# Patient Record
Sex: Male | Born: 1940 | Race: White | Hispanic: No | Marital: Married | Smoking: Never smoker
Health system: Southern US, Community
[De-identification: ages and names within clinical notes are randomized; demographics above are authoritative.]

## PROBLEM LIST (undated history)

## (undated) DIAGNOSIS — K219 Gastro-esophageal reflux disease without esophagitis: Secondary | ICD-10-CM

## (undated) DIAGNOSIS — K5790 Diverticulosis of intestine, part unspecified, without perforation or abscess without bleeding: Secondary | ICD-10-CM

## (undated) DIAGNOSIS — J302 Other seasonal allergic rhinitis: Secondary | ICD-10-CM

## (undated) DIAGNOSIS — N529 Male erectile dysfunction, unspecified: Secondary | ICD-10-CM

## (undated) DIAGNOSIS — E785 Hyperlipidemia, unspecified: Secondary | ICD-10-CM

## (undated) DIAGNOSIS — I499 Cardiac arrhythmia, unspecified: Secondary | ICD-10-CM

## (undated) DIAGNOSIS — M1990 Unspecified osteoarthritis, unspecified site: Secondary | ICD-10-CM

## (undated) DIAGNOSIS — I10 Essential (primary) hypertension: Secondary | ICD-10-CM

## (undated) DIAGNOSIS — R251 Tremor, unspecified: Secondary | ICD-10-CM

## (undated) DIAGNOSIS — H9319 Tinnitus, unspecified ear: Secondary | ICD-10-CM

## (undated) DIAGNOSIS — R4702 Dysphasia: Secondary | ICD-10-CM

## (undated) DIAGNOSIS — H9190 Unspecified hearing loss, unspecified ear: Secondary | ICD-10-CM

## (undated) HISTORY — PX: TONSILLECTOMY: SUR1361

## (undated) HISTORY — PX: SATURATION BIOPSY OF PROSTATE: SHX2375

## (undated) HISTORY — PX: OTHER SURGICAL HISTORY: SHX169

## (undated) HISTORY — PX: COLON SURGERY: SHX602

---

## 1997-09-25 ENCOUNTER — Ambulatory Visit (HOSPITAL_COMMUNITY): Admission: RE | Admit: 1997-09-25 | Discharge: 1997-09-25 | Payer: Self-pay | Admitting: Gastroenterology

## 2002-11-21 ENCOUNTER — Ambulatory Visit (HOSPITAL_COMMUNITY): Admission: RE | Admit: 2002-11-21 | Discharge: 2002-11-21 | Payer: Self-pay | Admitting: Gastroenterology

## 2006-05-05 ENCOUNTER — Inpatient Hospital Stay (HOSPITAL_COMMUNITY): Admission: EM | Admit: 2006-05-05 | Discharge: 2006-05-06 | Payer: Self-pay | Admitting: Emergency Medicine

## 2011-01-31 ENCOUNTER — Other Ambulatory Visit: Payer: Self-pay | Admitting: Dermatology

## 2012-10-07 ENCOUNTER — Other Ambulatory Visit: Payer: Self-pay | Admitting: Internal Medicine

## 2012-10-07 DIAGNOSIS — R1310 Dysphagia, unspecified: Secondary | ICD-10-CM

## 2012-10-18 ENCOUNTER — Ambulatory Visit
Admission: RE | Admit: 2012-10-18 | Discharge: 2012-10-18 | Disposition: A | Discharge status: Home / Self Care | Payer: Medicare Other | Source: Ambulatory Visit | Attending: Internal Medicine | Admitting: Internal Medicine

## 2012-10-18 DIAGNOSIS — R1310 Dysphagia, unspecified: Secondary | ICD-10-CM

## 2014-04-30 ENCOUNTER — Ambulatory Visit: Payer: Self-pay | Admitting: Orthopedic Surgery

## 2014-04-30 NOTE — Progress Notes (Signed)
Preoperative surgical orders have been place into the Epic hospital system for Dominic Young on 04/30/2014, 5:10 PM  by Mickel Crow for surgery on 05-04-2014.  Preop Total Knee orders including Experal, IV Tylenol, and IV Decadron as long as there are no contraindications to the above medications. PLEASE NOTE THAT THIS PATIENT WAS MOVED UP FROM 05/18/2014 TO 05/04/2014 BUT WOULD HAVE STILL BEEN OUT 10 DAYS ON ORDERS IF NOT MOVED TO AN EARLIER DATE. Arlee Muslim, PA-C

## 2014-04-30 NOTE — Progress Notes (Signed)
Please put orders in Epic surgery 05-04-14 pre op 05-01-14 Thanks

## 2014-05-01 ENCOUNTER — Encounter (HOSPITAL_COMMUNITY)
Admission: RE | Admit: 2014-05-01 | Discharge: 2014-05-01 | Disposition: A | Discharge status: Home / Self Care | Payer: Medicare Other | Source: Ambulatory Visit | Attending: Orthopedic Surgery | Admitting: Orthopedic Surgery

## 2014-05-01 ENCOUNTER — Encounter (HOSPITAL_COMMUNITY): Payer: Self-pay

## 2014-05-01 HISTORY — DX: Hyperlipidemia, unspecified: E78.5

## 2014-05-01 HISTORY — DX: Tremor, unspecified: R25.1

## 2014-05-01 HISTORY — DX: Diverticulosis of intestine, part unspecified, without perforation or abscess without bleeding: K57.90

## 2014-05-01 HISTORY — DX: Cardiac arrhythmia, unspecified: I49.9

## 2014-05-01 HISTORY — DX: Male erectile dysfunction, unspecified: N52.9

## 2014-05-01 HISTORY — DX: Dysphasia: R47.02

## 2014-05-01 HISTORY — DX: Unspecified osteoarthritis, unspecified site: M19.90

## 2014-05-01 HISTORY — DX: Gastro-esophageal reflux disease without esophagitis: K21.9

## 2014-05-01 HISTORY — DX: Tinnitus, unspecified ear: H93.19

## 2014-05-01 HISTORY — DX: Other seasonal allergic rhinitis: J30.2

## 2014-05-01 HISTORY — DX: Essential (primary) hypertension: I10

## 2014-05-01 HISTORY — DX: Unspecified hearing loss, unspecified ear: H91.90

## 2014-05-01 LAB — SURGICAL PCR SCREEN
MRSA, PCR: NEGATIVE
Staphylococcus aureus: NEGATIVE

## 2014-05-01 LAB — ABO/RH: ABO/RH(D): A POS

## 2014-05-01 LAB — APTT: APTT: 28 s (ref 24–37)

## 2014-05-01 LAB — PROTIME-INR
INR: 0.98 (ref 0.00–1.49)
PROTHROMBIN TIME: 13.1 s (ref 11.6–15.2)

## 2014-05-01 NOTE — Progress Notes (Addendum)
Clearance note per chart Dr Dagmar Hait 04/28/2014 CXR per chart 02/04/2014  CMP and CBC results per chart 04/28/2014  Urinalysis and micro results per chart 04/28/2014  EKG per chart 02/04/2014  Noted per H&P per Dr Dagmar Hait pt was prescribed propanolol for tremors pt states only took 3 tabs and saw no different so he discontinued use

## 2014-05-01 NOTE — Patient Instructions (Signed)
20 Dominic Young  05/01/2014   Your procedure is scheduled on:  Monday May 04, 2014   Report to [facility] Main Entrance and follow signs to  [place] arrive at 5:00 AM.   Call this number if you have problems the morning of surgery [phone] or Presurgical Testing [phone].   Remember:  Do not eat food or drink liquids :After Midnight.  For Living Will and/or Health Care Power Attorney Forms: please provide copy for your medical record, may bring AM of surgery (forms should be already notarized-we do not provide this service).     Take these medicines the morning of surgery with A SIP OF WATER: Pravastatin                               You may not have any metal on your body including hair pins and piercings  Do not wear jewelry,lotions, powders,colognes or deodorant.  Men may shave face and neck.               Do not bring valuables to the hospital. [place].  Contacts, dentures or bridgework may not be worn into surgery.  Leave suitcase in the car. After surgery it may be brought to your room.  For patients admitted to the hospital, checkout time is 11:00 AM the day of discharge.   Special Instructions: review fact sheets for MRSA information, Blood Transfusion fact sheet, Incentive Spirometry.  Remember: Type/Screen "Blue armsbands"- may not be removed once applied(would result in being retested AM of surgery, if removed). ________________________________________________________________________  [facility] - Preparing for Surgery Before surgery, you can play an important role.  Because skin is not sterile, your skin needs to be as free of germs as possible.  You can reduce the number of germs on your skin by washing with CHG (chlorahexidine gluconate) soap before surgery.  CHG is an antiseptic cleaner which kills germs and bonds with the skin to continue killing germs even after washing. Please DO NOT use if you  have an allergy to CHG or antibacterial soaps.  If your skin becomes reddened/irritated stop using the CHG and inform your nurse when you arrive at Short Stay. Do not shave (including legs and underarms) for at least 48 hours prior to the first CHG shower.  You may shave your face/neck. Please follow these instructions carefully:  1.  Shower with CHG Soap the night before surgery and the  morning of Surgery.  2.  If you choose to wash your hair, wash your hair first as usual with your  normal  shampoo.  3.  After you shampoo, rinse your hair and body thoroughly to remove the  shampoo.                           4.  Use CHG as you would any other liquid soap.  You can apply chg directly  to the skin and wash                       Gently with a scrungie or clean washcloth.  5.  Apply the CHG Soap to your body ONLY FROM THE NECK DOWN.   Do not use on face/ open  Wound or open sores. Avoid contact with eyes, ears mouth and genitals (private parts).                       Wash face,  Genitals (private parts) with your normal soap.             6.  Wash thoroughly, paying special attention to the area where your surgery  will be performed.  7.  Thoroughly rinse your body with warm water from the neck down.  8.  DO NOT shower/wash with your normal soap after using and rinsing off  the CHG Soap.                9.  Pat yourself dry with a clean towel.            10.  Wear clean pajamas.            11.  Place clean sheets on your bed the night of your first shower and do not  sleep with pets. Day of Surgery : Do not apply any lotions/deodorants the morning of surgery.  Please wear clean clothes to the hospital/surgery center.  FAILURE TO FOLLOW THESE INSTRUCTIONS MAY RESULT IN THE CANCELLATION OF YOUR SURGERY PATIENT SIGNATURE_________________________________  NURSE  SIGNATURE__________________________________  ________________________________________________________________________   Dominic Young  An incentive spirometer is a tool that can help keep your lungs clear and active. This tool measures how well you are filling your lungs with each breath. Taking long deep breaths may help reverse or decrease the chance of developing breathing (pulmonary) problems (especially infection) following:  A long period of time when you are unable to move or be active. BEFORE THE PROCEDURE   If the spirometer includes an indicator to show your best effort, your nurse or respiratory therapist will set it to a desired goal.  If possible, sit up straight or lean slightly forward. Try not to slouch.  Hold the incentive spirometer in an upright position. INSTRUCTIONS FOR USE   Sit on the edge of your bed if possible, or sit up as far as you can in bed or on a chair.  Hold the incentive spirometer in an upright position.  Breathe out normally.  Place the mouthpiece in your mouth and seal your lips tightly around it.  Breathe in slowly and as deeply as possible, raising the piston or the ball toward the top of the column.  Hold your breath for 3-5 seconds or for as long as possible. Allow the piston or ball to fall to the bottom of the column.  Remove the mouthpiece from your mouth and breathe out normally.  Rest for a few seconds and repeat Steps 1 through 7 at least 10 times every 1-2 hours when you are awake. Take your time and take a few normal breaths between deep breaths.  The spirometer may include an indicator to show your best effort. Use the indicator as a goal to work toward during each repetition.  After each set of 10 deep breaths, practice coughing to be sure your lungs are clear. If you have an incision (the cut made at the time of surgery), support your incision when coughing by placing a pillow or rolled up towels firmly against it. Once  you are able to get out of bed, walk around indoors and cough well. You may stop using the incentive spirometer when instructed by your caregiver.  RISKS AND COMPLICATIONS  Take your time so you do not  get dizzy or light-headed.  If you are in pain, you may need to take or ask for pain medication before doing incentive spirometry. It is harder to take a deep breath if you are having pain. AFTER USE  Rest and breathe slowly and easily.  It can be helpful to keep track of a log of your progress. Your caregiver can provide you with a simple table to help with this. If you are using the spirometer at home, follow these instructions: [place] IF:   You are having difficultly using the spirometer.  You have trouble using the spirometer as often as instructed.  Your pain medication is not giving enough relief while using the spirometer.  You develop fever of 100.5 F (38.1 C) or higher. SEEK IMMEDIATE MEDICAL CARE IF:   You cough up bloody sputum that had not been present before.  You develop fever of 102 F (38.9 C) or greater.  You develop worsening pain at or near the incision site. MAKE SURE YOU:   Understand these instructions.  Will watch your condition.  Will get help right away if you are not doing well or get worse. Document Released: 07/31/2006 Document Revised: 06/12/2011 Document Reviewed: 10/01/2006 Exit[employer] Patient Information 2014 [employer], [place].   ________________________________________________________________________  WHAT IS A BLOOD TRANSFUSION? Blood Transfusion Information  A transfusion is the replacement of blood or some of its parts. Blood is made up of multiple cells which provide different functions.  Red blood cells carry oxygen and are used for blood loss replacement.  White blood cells fight against infection.  Platelets control bleeding.  Plasma helps clot blood.  Other blood products are available for specialized needs, such as  hemophilia or other clotting disorders. BEFORE THE TRANSFUSION  Who gives blood for transfusions?   Healthy volunteers who are fully evaluated to make sure their blood is safe. This is blood bank blood. Transfusion therapy is the safest it has ever been in the practice of medicine. Before blood is taken from a donor, a complete history is taken to make sure that person has no history of diseases nor engages in risky social behavior (examples are intravenous drug use or sexual activity with multiple partners). The donor's travel history is screened to minimize risk of transmitting infections, such as malaria. The donated blood is tested for signs of infectious diseases, such as HIV and hepatitis. The blood is then tested to be sure it is compatible with you in order to minimize the chance of a transfusion reaction. If you or a relative donates blood, this is often done in anticipation of surgery and is not appropriate for emergency situations. It takes many days to process the donated blood. RISKS AND COMPLICATIONS Although transfusion therapy is very safe and saves many lives, the main dangers of transfusion include:   Getting an infectious disease.  Developing a transfusion reaction. This is an allergic reaction to something in the blood you were given. Every precaution is taken to prevent this. The decision to have a blood transfusion has been considered carefully by your caregiver before blood is given. Blood is not given unless the benefits outweigh the risks. AFTER THE TRANSFUSION  Right after receiving a blood transfusion, you will usually feel much better and more energetic. This is especially true if your red blood cells have gotten low (anemic). The transfusion raises the level of the red blood cells which carry oxygen, and this usually causes an energy increase.  The nurse administering the transfusion will  monitor you carefully for complications. HOME CARE INSTRUCTIONS  No special  instructions are needed after a transfusion. You may find your energy is better. Speak with your caregiver about any limitations on activity for underlying diseases you may have. SEEK MEDICAL CARE IF:   Your condition is not improving after your transfusion.  You develop redness or irritation at the intravenous (IV) site. SEEK IMMEDIATE MEDICAL CARE IF:  Any of the following symptoms occur over the next 12 hours:  Shaking chills.  You have a temperature by mouth above 102 F (38.9 C), not controlled by medicine.  Chest, back, or muscle pain.  People around you feel you are not acting correctly or are confused.  Shortness of breath or difficulty breathing.  Dizziness and fainting.  You get a rash or develop hives.  You have a decrease in urine output.  Your urine turns a dark color or changes to pink, red, or brown. Any of the following symptoms occur over the next 10 days:  You have a temperature by mouth above 102 F (38.9 C), not controlled by medicine.  Shortness of breath.  Weakness after normal activity.  The white part of the eye turns yellow (jaundice).  You have a decrease in the amount of urine or are urinating less often.  Your urine turns a dark color or changes to pink, red, or brown. Document Released: 03/17/2000 Document Revised: 06/12/2011 Document Reviewed: 11/04/2007 [facility] Patient Information 2014 [place], [place].  _______________________________________________________________________

## 2014-05-01 NOTE — Progress Notes (Signed)
Your patient has screened at an elevated risk for Obstructive Sleep Apnea using the Stop-Bang Tool during a pre-surgical vist. A score of 4 or greater is an elevated risk. Score of 4. 

## 2014-05-01 NOTE — H&P (Signed)
TOTAL KNEE ADMISSION H&P  Patient is being admitted for right total knee arthroplasty.  Subjective:  Chief Complaint:right knee pain.  HPI: Dominic Young, 74 y.o. male, has a history of pain and functional disability in the right knee due to arthritis and has failed non-surgical conservative treatments for greater than 12 weeks to includeNSAID's and/or analgesics, corticosteriod injections and activity modification.  Onset of symptoms was gradual, starting >10 years ago with gradually worsening course since that time. The patient noted prior procedures on the knee to include  arthroscopy and menisectomy on the right knee(s).  Patient currently rates pain in the right knee(s) at 7 out of 10 with activity. Patient has night pain, worsening of pain with activity and weight bearing, pain that interferes with activities of daily living, pain with passive range of motion, crepitus and joint swelling.  Patient has evidence of periarticular osteophytes and joint space narrowing by imaging studies. There is no active infection.   Past Medical History  Diagnosis Date  . Hypertension   . Dysrhythmia     when young had issues with heart palpitations altered diet and has not had problems since   . GERD (gastroesophageal reflux disease)     manages with diet   . Arthritis     Past Surgical History  Procedure Laterality Date  . Right knee sugery      1965 cartilage removed 1983 repaired of damaged ligament   . Tonsillectomy    . Saturation biopsy of prostate    . Colonscopy     . 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  aspirin EC 81 MG tablet, Take 81 mg by mouth daily., Disp: , Rfl:  .  ibuprofen (ADVIL,MOTRIN) 200 MG tablet, Take 400 mg by mouth every 6 (six) hours as needed for mild pain., Disp: , Rfl:  .  losartan (COZAAR) 50 MG tablet, Take 50 mg by mouth every morning., Disp: , Rfl:  .  pravastatin (PRAVACHOL) 40 MG tablet, Take 40 mg  by mouth daily., Disp: , Rfl:   Allergies  Allergen Reactions  . Xylocaine [Lidocaine]     Heart races    History  Substance Use Topics  . Smoking status: Never Smoker   . Smokeless tobacco: Never Used  . Alcohol Use: No      Review of Systems  Constitutional: Negative.   HENT: Negative.   Eyes: Negative.   Respiratory: Negative.   Cardiovascular: Negative.   Gastrointestinal: Positive for heartburn. Negative for nausea, vomiting, abdominal pain, diarrhea, constipation, blood in stool and melena.  Genitourinary: Negative.   Musculoskeletal: Positive for joint pain. Negative for myalgias, back pain, falls and neck pain.       Right knee pain  Skin: Negative.   Neurological: Negative.   Endo/Heme/Allergies: Negative.   Psychiatric/Behavioral: Negative.     Objective:  Physical Exam  Constitutional: He is oriented to person, place, and time. He appears well-developed and well-nourished. No distress.  HENT:  Head: Normocephalic and atraumatic.  Right Ear: External ear normal.  Left Ear: External ear normal.  Nose: Nose normal.  Mouth/Throat: Oropharynx is clear and moist.  Eyes: Conjunctivae and EOM are normal.  Neck: Normal range of motion. Neck supple.  Cardiovascular: Normal rate, regular rhythm, normal heart sounds and intact distal pulses.   No murmur heard. Respiratory: Effort normal and breath sounds normal. No respiratory distress. He has no wheezes.  GI: Soft. Bowel  sounds are normal. He exhibits no distension. There is no tenderness.  Musculoskeletal:       Right hip: Normal.       Left hip: Normal.       Right knee: He exhibits decreased range of motion and swelling. He exhibits no effusion and no erythema. Tenderness found. Medial joint line and lateral joint line tenderness noted.       Left knee: Normal.       Right lower leg: He exhibits no tenderness and no swelling.       Left lower leg: He exhibits no tenderness and no swelling.  His right knee  shows no effusion. There is marked crepitus on range of motion of the right knee. Range is about 5-130. Tenderness medial greater than lateral with no instability.  Neurological: He is alert and oriented to person, place, and time. He has normal strength and normal reflexes. No sensory deficit.  Skin: No rash noted. He is not diaphoretic. No erythema.  Psychiatric: He has a normal mood and affect. His behavior is normal.    Vital signs in last 24 hours: Temp:  [98 F (36.7 C)] 98 F (36.7 C) (01/29 0819) Pulse Rate:  [64] 64 (01/29 0819) Resp:  [16] 16 (01/29 0819) BP: (158)/(68) 158/68 mmHg (01/29 0819) SpO2:  [98 %] 98 % (01/29 0819) Weight:  [95.255 kg (210 lb)] 95.255 kg (210 lb) (01/29 0819)  Imaging Review Plain radiographs demonstrate severe degenerative joint disease of the right knee(s). The overall alignment ismild varus. The bone quality appears to be good for age and reported activity level.  Assessment/Plan:  End stage arthritis, right knee   The patient history, physical examination, clinical judgment of the provider and imaging studies are consistent with end stage degenerative joint disease of the right knee(s) and total knee arthroplasty is deemed medically necessary. The treatment options including medical management, injection therapy arthroscopy and arthroplasty were discussed at length. The risks and benefits of total knee arthroplasty were presented and reviewed. The risks due to aseptic loosening, infection, stiffness, patella tracking problems, thromboembolic complications and other imponderables were discussed. The patient acknowledged the explanation, agreed to proceed with the plan and consent was signed. Patient is being admitted for inpatient treatment for surgery, pain control, PT, OT, prophylactic antibiotics, VTE prophylaxis, progressive ambulation and ADL's and discharge planning. The patient is planning to be discharged to skilled nursing facility     TXA IV Does not want [facility] PCP: Dr. Darra Lis, PA-C

## 2014-05-04 ENCOUNTER — Encounter (HOSPITAL_COMMUNITY)
Admission: RE | Disposition: A | Discharge status: Home Health | Payer: Self-pay | Source: Ambulatory Visit | Attending: Orthopedic Surgery

## 2014-05-04 ENCOUNTER — Inpatient Hospital Stay (HOSPITAL_COMMUNITY)
Admission: RE | Admit: 2014-05-04 | Discharge: 2014-05-06 | DRG: 470 | Disposition: A | Discharge status: Home Health | Payer: Medicare Other | Source: Ambulatory Visit | Attending: Orthopedic Surgery | Admitting: Orthopedic Surgery

## 2014-05-04 ENCOUNTER — Inpatient Hospital Stay (HOSPITAL_COMMUNITY): Payer: Medicare Other | Admitting: Anesthesiology

## 2014-05-04 ENCOUNTER — Encounter (HOSPITAL_COMMUNITY): Payer: Self-pay | Admitting: *Deleted

## 2014-05-04 DIAGNOSIS — H9192 Unspecified hearing loss, left ear: Secondary | ICD-10-CM | POA: Diagnosis present

## 2014-05-04 DIAGNOSIS — Z79899 Other long term (current) drug therapy: Secondary | ICD-10-CM

## 2014-05-04 DIAGNOSIS — Z7982 Long term (current) use of aspirin: Secondary | ICD-10-CM | POA: Diagnosis not present

## 2014-05-04 DIAGNOSIS — M1711 Unilateral primary osteoarthritis, right knee: Secondary | ICD-10-CM | POA: Diagnosis present

## 2014-05-04 DIAGNOSIS — K219 Gastro-esophageal reflux disease without esophagitis: Secondary | ICD-10-CM | POA: Diagnosis present

## 2014-05-04 DIAGNOSIS — I10 Essential (primary) hypertension: Secondary | ICD-10-CM | POA: Diagnosis present

## 2014-05-04 DIAGNOSIS — M179 Osteoarthritis of knee, unspecified: Secondary | ICD-10-CM | POA: Diagnosis present

## 2014-05-04 DIAGNOSIS — Z01812 Encounter for preprocedural laboratory examination: Secondary | ICD-10-CM | POA: Diagnosis not present

## 2014-05-04 DIAGNOSIS — M25561 Pain in right knee: Secondary | ICD-10-CM | POA: Diagnosis present

## 2014-05-04 DIAGNOSIS — M1710 Unilateral primary osteoarthritis, unspecified knee: Secondary | ICD-10-CM | POA: Diagnosis present

## 2014-05-04 DIAGNOSIS — M1712 Unilateral primary osteoarthritis, left knee: Secondary | ICD-10-CM | POA: Diagnosis present

## 2014-05-04 HISTORY — PX: TOTAL KNEE ARTHROPLASTY: SHX125

## 2014-05-04 LAB — TYPE AND SCREEN
ABO/RH(D): A POS
Antibody Screen: NEGATIVE

## 2014-05-04 SURGERY — ARTHROPLASTY, KNEE, TOTAL
Anesthesia: Spinal | Laterality: Right

## 2014-05-04 MED ORDER — ONDANSETRON HCL 4 MG/2ML IJ SOLN
INTRAMUSCULAR | Status: DC | PRN
  Administered 2014-05-04: 4 mg via INTRAVENOUS

## 2014-05-04 MED ORDER — ACETAMINOPHEN 650 MG RE SUPP: 650.0000 mg | Freq: Four times a day (QID) | RECTAL | Status: DC | PRN

## 2014-05-04 MED ORDER — METOCLOPRAMIDE HCL 10 MG PO TABS: 5.0000 mg | ORAL_TABLET | Freq: Three times a day (TID) | ORAL | Status: DC | PRN

## 2014-05-04 MED ORDER — CEFAZOLIN SODIUM-DEXTROSE 2-3 GM-% IV SOLR
2.0000 g | INTRAVENOUS | Status: AC
  Administered 2014-05-04: 2 g via INTRAVENOUS

## 2014-05-04 MED ORDER — CEFAZOLIN SODIUM-DEXTROSE 2-3 GM-% IV SOLR
INTRAVENOUS | Status: AC
  Filled 2014-05-04: qty 50

## 2014-05-04 MED ORDER — SODIUM CHLORIDE 0.9 % IV SOLN
INTRAVENOUS | Status: DC
  Administered 2014-05-04 (×2): via INTRAVENOUS

## 2014-05-04 MED ORDER — MORPHINE SULFATE 2 MG/ML IJ SOLN: 1.0000 mg | INTRAMUSCULAR | Status: DC | PRN

## 2014-05-04 MED ORDER — DEXAMETHASONE SODIUM PHOSPHATE 10 MG/ML IJ SOLN
INTRAMUSCULAR | Status: AC
  Filled 2014-05-04: qty 1

## 2014-05-04 MED ORDER — BUPIVACAINE IN DEXTROSE 0.75-8.25 % IT SOLN
INTRATHECAL | Status: DC | PRN
  Administered 2014-05-04: 1.5 mL via INTRATHECAL

## 2014-05-04 MED ORDER — RIVAROXABAN 10 MG PO TABS
10.0000 mg | ORAL_TABLET | Freq: Every day | ORAL | Status: DC
  Administered 2014-05-05 – 2014-05-06 (×2): 10 mg via ORAL
  Filled 2014-05-04 (×3): qty 1

## 2014-05-04 MED ORDER — KETOROLAC TROMETHAMINE 15 MG/ML IJ SOLN
INTRAMUSCULAR | Status: AC
  Filled 2014-05-04: qty 1

## 2014-05-04 MED ORDER — KETOROLAC TROMETHAMINE 15 MG/ML IJ SOLN
7.5000 mg | Freq: Four times a day (QID) | INTRAMUSCULAR | Status: AC | PRN
  Administered 2014-05-04: 7.5 mg via INTRAVENOUS

## 2014-05-04 MED ORDER — PHENYLEPHRINE 40 MCG/ML (10ML) SYRINGE FOR IV PUSH (FOR BLOOD PRESSURE SUPPORT)
PREFILLED_SYRINGE | INTRAVENOUS | Status: AC
  Filled 2014-05-04: qty 10

## 2014-05-04 MED ORDER — PHENOL 1.4 % MT LIQD
1.0000 | OROMUCOSAL | Status: DC | PRN
  Filled 2014-05-04: qty 177

## 2014-05-04 MED ORDER — HYDROMORPHONE HCL 1 MG/ML IJ SOLN
INTRAMUSCULAR | Status: AC
  Filled 2014-05-04: qty 1

## 2014-05-04 MED ORDER — LACTATED RINGERS IV SOLN
INTRAVENOUS | Status: DC | PRN
  Administered 2014-05-04 (×2): via INTRAVENOUS

## 2014-05-04 MED ORDER — FENTANYL CITRATE 0.05 MG/ML IJ SOLN
INTRAMUSCULAR | Status: AC
  Filled 2014-05-04: qty 2

## 2014-05-04 MED ORDER — SODIUM CHLORIDE 0.9 % IV SOLN
INTRAVENOUS | Status: DC
Start: 2014-05-04 — End: 2014-05-04

## 2014-05-04 MED ORDER — OXYCODONE HCL 5 MG PO TABS
5.0000 mg | ORAL_TABLET | ORAL | Status: DC | PRN
  Administered 2014-05-04 – 2014-05-05 (×4): 5 mg via ORAL
  Administered 2014-05-05: 10 mg via ORAL
  Administered 2014-05-05 (×5): 5 mg via ORAL
  Administered 2014-05-06 (×3): 10 mg via ORAL
  Filled 2014-05-04: qty 1
  Filled 2014-05-04 (×2): qty 2
  Filled 2014-05-04: qty 1
  Filled 2014-05-04 (×2): qty 2
  Filled 2014-05-04 (×5): qty 1

## 2014-05-04 MED ORDER — MIDAZOLAM HCL 2 MG/2ML IJ SOLN
INTRAMUSCULAR | Status: AC
  Filled 2014-05-04: qty 2

## 2014-05-04 MED ORDER — ONDANSETRON HCL 4 MG PO TABS: 4.0000 mg | ORAL_TABLET | Freq: Four times a day (QID) | ORAL | Status: DC | PRN

## 2014-05-04 MED ORDER — PROPOFOL 10 MG/ML IV BOLUS
INTRAVENOUS | Status: AC
  Filled 2014-05-04: qty 20

## 2014-05-04 MED ORDER — METHOCARBAMOL 500 MG PO TABS: 500.0000 mg | ORAL_TABLET | Freq: Four times a day (QID) | ORAL | Status: DC | PRN

## 2014-05-04 MED ORDER — MENTHOL 3 MG MT LOZG
1.0000 | LOZENGE | OROMUCOSAL | Status: DC | PRN
  Filled 2014-05-04: qty 9

## 2014-05-04 MED ORDER — CEFAZOLIN SODIUM-DEXTROSE 2-3 GM-% IV SOLR
2.0000 g | Freq: Four times a day (QID) | INTRAVENOUS | Status: AC
  Administered 2014-05-04 (×2): 2 g via INTRAVENOUS
  Filled 2014-05-04 (×2): qty 50

## 2014-05-04 MED ORDER — DEXAMETHASONE SODIUM PHOSPHATE 10 MG/ML IJ SOLN
10.0000 mg | Freq: Once | INTRAMUSCULAR | Status: AC
  Administered 2014-05-05: 10 mg via INTRAVENOUS
  Filled 2014-05-04: qty 1

## 2014-05-04 MED ORDER — PHENYLEPHRINE HCL 10 MG/ML IJ SOLN
INTRAMUSCULAR | Status: DC | PRN
  Administered 2014-05-04: 80 ug via INTRAVENOUS
  Administered 2014-05-04 (×2): 40 ug via INTRAVENOUS

## 2014-05-04 MED ORDER — TRAMADOL HCL 50 MG PO TABS: 50.0000 mg | ORAL_TABLET | Freq: Four times a day (QID) | ORAL | Status: DC | PRN

## 2014-05-04 MED ORDER — TRANEXAMIC ACID 100 MG/ML IV SOLN
1000.0000 mg | INTRAVENOUS | Status: AC
  Administered 2014-05-04: 1000 mg via INTRAVENOUS
  Filled 2014-05-04: qty 10

## 2014-05-04 MED ORDER — ACETAMINOPHEN 325 MG PO TABS: 650.0000 mg | ORAL_TABLET | Freq: Four times a day (QID) | ORAL | Status: DC | PRN

## 2014-05-04 MED ORDER — HYDROMORPHONE HCL 1 MG/ML IJ SOLN
0.2500 mg | INTRAMUSCULAR | Status: DC | PRN
  Administered 2014-05-04 (×4): 0.5 mg via INTRAVENOUS

## 2014-05-04 MED ORDER — ONDANSETRON HCL 4 MG/2ML IJ SOLN: 4.0000 mg | Freq: Four times a day (QID) | INTRAMUSCULAR | Status: DC | PRN

## 2014-05-04 MED ORDER — MIDAZOLAM HCL 5 MG/5ML IJ SOLN
INTRAMUSCULAR | Status: DC | PRN
  Administered 2014-05-04: 2 mg via INTRAVENOUS

## 2014-05-04 MED ORDER — ACETAMINOPHEN 500 MG PO TABS
1000.0000 mg | ORAL_TABLET | Freq: Four times a day (QID) | ORAL | Status: AC
  Administered 2014-05-04 – 2014-05-05 (×4): 1000 mg via ORAL
  Filled 2014-05-04 (×4): qty 2

## 2014-05-04 MED ORDER — LOSARTAN POTASSIUM 50 MG PO TABS
50.0000 mg | ORAL_TABLET | Freq: Every morning | ORAL | Status: DC
  Administered 2014-05-04 – 2014-05-06 (×3): 50 mg via ORAL
  Filled 2014-05-04 (×3): qty 1

## 2014-05-04 MED ORDER — FENTANYL CITRATE 0.05 MG/ML IJ SOLN
INTRAMUSCULAR | Status: DC | PRN
  Administered 2014-05-04 (×2): 25 ug via INTRAVENOUS
  Administered 2014-05-04: 100 ug via INTRAVENOUS
  Administered 2014-05-04 (×2): 25 ug via INTRAVENOUS

## 2014-05-04 MED ORDER — POLYETHYLENE GLYCOL 3350 17 G PO PACK: 17.0000 g | PACK | Freq: Every day | ORAL | Status: DC | PRN

## 2014-05-04 MED ORDER — BUPIVACAINE LIPOSOME 1.3 % IJ SUSP
INTRAMUSCULAR | Status: DC | PRN
  Administered 2014-05-04: 20 mL

## 2014-05-04 MED ORDER — ONDANSETRON HCL 4 MG/2ML IJ SOLN
INTRAMUSCULAR | Status: AC
  Filled 2014-05-04: qty 2

## 2014-05-04 MED ORDER — METHOCARBAMOL 1000 MG/10ML IJ SOLN
500.0000 mg | Freq: Four times a day (QID) | INTRAVENOUS | Status: DC | PRN
  Administered 2014-05-04: 500 mg via INTRAVENOUS
  Filled 2014-05-04 (×2): qty 5

## 2014-05-04 MED ORDER — BISACODYL 10 MG RE SUPP: 10.0000 mg | Freq: Every day | RECTAL | Status: DC | PRN

## 2014-05-04 MED ORDER — FENTANYL CITRATE 0.05 MG/ML IJ SOLN
25.0000 ug | INTRAMUSCULAR | Status: DC | PRN
  Administered 2014-05-04 (×2): 50 ug via INTRAVENOUS

## 2014-05-04 MED ORDER — 0.9 % SODIUM CHLORIDE (POUR BTL) OPTIME
TOPICAL | Status: DC | PRN
  Administered 2014-05-04: 1000 mL

## 2014-05-04 MED ORDER — BUPIVACAINE HCL (PF) 0.25 % IJ SOLN
INTRAMUSCULAR | Status: AC
  Filled 2014-05-04: qty 30

## 2014-05-04 MED ORDER — MEPERIDINE HCL 50 MG/ML IJ SOLN: 6.2500 mg | INTRAMUSCULAR | Status: DC | PRN

## 2014-05-04 MED ORDER — PROPOFOL 10 MG/ML IV BOLUS
INTRAVENOUS | Status: DC | PRN
  Administered 2014-05-04: 100 mg via INTRAVENOUS

## 2014-05-04 MED ORDER — FLEET ENEMA 7-19 GM/118ML RE ENEM: 1.0000 | ENEMA | Freq: Once | RECTAL | Status: AC | PRN

## 2014-05-04 MED ORDER — PROMETHAZINE HCL 25 MG/ML IJ SOLN: 6.2500 mg | INTRAMUSCULAR | Status: DC | PRN

## 2014-05-04 MED ORDER — ACETAMINOPHEN 10 MG/ML IV SOLN
1000.0000 mg | Freq: Once | INTRAVENOUS | Status: AC
  Administered 2014-05-04: 1000 mg via INTRAVENOUS
  Filled 2014-05-04: qty 100

## 2014-05-04 MED ORDER — SODIUM CHLORIDE 0.9 % IR SOLN
Status: DC | PRN
  Administered 2014-05-04: 1000 mL

## 2014-05-04 MED ORDER — SODIUM CHLORIDE 0.9 % IJ SOLN
INTRAMUSCULAR | Status: AC
  Filled 2014-05-04: qty 50

## 2014-05-04 MED ORDER — DEXAMETHASONE SODIUM PHOSPHATE 10 MG/ML IJ SOLN
10.0000 mg | Freq: Once | INTRAMUSCULAR | Status: AC
Start: 2014-05-04 — End: 2014-05-04
  Administered 2014-05-04: 10 mg via INTRAVENOUS

## 2014-05-04 MED ORDER — DIPHENHYDRAMINE HCL 12.5 MG/5ML PO ELIX: 12.5000 mg | ORAL_SOLUTION | ORAL | Status: DC | PRN

## 2014-05-04 MED ORDER — METOCLOPRAMIDE HCL 5 MG/ML IJ SOLN: 5.0000 mg | Freq: Three times a day (TID) | INTRAMUSCULAR | Status: DC | PRN

## 2014-05-04 MED ORDER — PROPOFOL INFUSION 10 MG/ML OPTIME
INTRAVENOUS | Status: DC | PRN
  Administered 2014-05-04: 75 ug/kg/min via INTRAVENOUS

## 2014-05-04 MED ORDER — BUPIVACAINE LIPOSOME 1.3 % IJ SUSP
20.0000 mL | Freq: Once | INTRAMUSCULAR | Status: DC
  Filled 2014-05-04: qty 20

## 2014-05-04 MED ORDER — BUPIVACAINE HCL 0.25 % IJ SOLN
INTRAMUSCULAR | Status: DC | PRN
Start: 2014-05-04 — End: 2014-05-04
  Administered 2014-05-04: 20 mL

## 2014-05-04 MED ORDER — CHLORHEXIDINE GLUCONATE 4 % EX LIQD: 60.0000 mL | Freq: Once | CUTANEOUS | Status: DC

## 2014-05-04 MED ORDER — DOCUSATE SODIUM 100 MG PO CAPS
100.0000 mg | ORAL_CAPSULE | Freq: Two times a day (BID) | ORAL | Status: DC
Start: 2014-05-04 — End: 2014-05-06
  Administered 2014-05-04 – 2014-05-06 (×5): 100 mg via ORAL

## 2014-05-04 SURGICAL SUPPLY — 63 items
BAG SPEC THK2 15X12 ZIP CLS (MISCELLANEOUS) ×1
BAG ZIPLOCK 12X15 (MISCELLANEOUS) ×2 IMPLANT
BANDAGE ELASTIC 6 VELCRO ST LF (GAUZE/BANDAGES/DRESSINGS) ×2 IMPLANT
BANDAGE ESMARK 6X9 LF (GAUZE/BANDAGES/DRESSINGS) ×1 IMPLANT
BLADE SAG 18X100X1.27 (BLADE) ×2 IMPLANT
BLADE SAW SGTL 11.0X1.19X90.0M (BLADE) ×2 IMPLANT
BNDG CMPR 82X61 PLY HI ABS (GAUZE/BANDAGES/DRESSINGS) ×1
BNDG CMPR 9X6 STRL LF SNTH (GAUZE/BANDAGES/DRESSINGS) ×1
BNDG CONFORM 6X.82 1P STRL (GAUZE/BANDAGES/DRESSINGS) ×1 IMPLANT
BNDG ESMARK 6X9 LF (GAUZE/BANDAGES/DRESSINGS) ×2
BOWL SMART MIX CTS (DISPOSABLE) ×2 IMPLANT
CAP KNEE TOTAL 3 SIGMA ×1 IMPLANT
CEMENT HV SMART SET (Cement) ×4 IMPLANT
CUFF TOURN SGL QUICK 34 (TOURNIQUET CUFF) ×2
CUFF TRNQT CYL 34X4X40X1 (TOURNIQUET CUFF) ×1 IMPLANT
DECANTER SPIKE VIAL GLASS SM (MISCELLANEOUS) ×2 IMPLANT
DRAPE EXTREMITY T 121X128X90 (DRAPE) ×2 IMPLANT
DRAPE POUCH INSTRU U-SHP 10X18 (DRAPES) ×2 IMPLANT
DRAPE U-SHAPE 47X51 STRL (DRAPES) ×2 IMPLANT
DRSG ADAPTIC 3X8 NADH LF (GAUZE/BANDAGES/DRESSINGS) ×2 IMPLANT
DRSG PAD ABDOMINAL 8X10 ST (GAUZE/BANDAGES/DRESSINGS) ×2 IMPLANT
DURAPREP 26ML APPLICATOR (WOUND CARE) ×2 IMPLANT
ELECT REM PT RETURN 9FT ADLT (ELECTROSURGICAL) ×2
ELECTRODE REM PT RTRN 9FT ADLT (ELECTROSURGICAL) ×1 IMPLANT
EVACUATOR 1/8 PVC DRAIN (DRAIN) ×2 IMPLANT
FACESHIELD WRAPAROUND (MASK) ×10 IMPLANT
FACESHIELD WRAPAROUND OR TEAM (MASK) ×5 IMPLANT
GAUZE SPONGE 4X4 12PLY STRL (GAUZE/BANDAGES/DRESSINGS) ×2 IMPLANT
GLOVE BIO SURGEON STRL SZ7.5 (GLOVE) IMPLANT
GLOVE BIO SURGEON STRL SZ8 (GLOVE) ×2 IMPLANT
GLOVE BIOGEL PI IND STRL 6.5 (GLOVE) IMPLANT
GLOVE BIOGEL PI IND STRL 8 (GLOVE) ×1 IMPLANT
GLOVE BIOGEL PI INDICATOR 6.5 (GLOVE)
GLOVE BIOGEL PI INDICATOR 8 (GLOVE) ×1
GLOVE SURG SS PI 6.5 STRL IVOR (GLOVE) IMPLANT
GOWN STRL REUS W/TWL LRG LVL3 (GOWN DISPOSABLE) ×2 IMPLANT
GOWN STRL REUS W/TWL XL LVL3 (GOWN DISPOSABLE) IMPLANT
HANDPIECE INTERPULSE COAX TIP (DISPOSABLE) ×2
IMMOBILIZER KNEE 20 (SOFTGOODS) ×2
IMMOBILIZER KNEE 20 THIGH 36 (SOFTGOODS) ×1 IMPLANT
KIT BASIN OR (CUSTOM PROCEDURE TRAY) ×2 IMPLANT
MANIFOLD NEPTUNE II (INSTRUMENTS) ×2 IMPLANT
NDL SAFETY ECLIPSE 18X1.5 (NEEDLE) ×2 IMPLANT
NEEDLE HYPO 18GX1.5 SHARP (NEEDLE) ×4
NS IRRIG 1000ML POUR BTL (IV SOLUTION) ×2 IMPLANT
PACK TOTAL JOINT (CUSTOM PROCEDURE TRAY) ×2 IMPLANT
PADDING CAST COTTON 6X4 STRL (CAST SUPPLIES) ×6 IMPLANT
POSITIONER SURGICAL ARM (MISCELLANEOUS) ×2 IMPLANT
SET HNDPC FAN SPRY TIP SCT (DISPOSABLE) ×1 IMPLANT
STRIP CLOSURE SKIN 1/2X4 (GAUZE/BANDAGES/DRESSINGS) ×4 IMPLANT
SUCTION FRAZIER 12FR DISP (SUCTIONS) ×2 IMPLANT
SUT MNCRL AB 4-0 PS2 18 (SUTURE) ×2 IMPLANT
SUT VIC AB 2-0 CT1 27 (SUTURE) ×6
SUT VIC AB 2-0 CT1 TAPERPNT 27 (SUTURE) ×3 IMPLANT
SUT VLOC 180 0 24IN GS25 (SUTURE) ×2 IMPLANT
SYR 20CC LL (SYRINGE) ×2 IMPLANT
SYR 50ML LL SCALE MARK (SYRINGE) ×2 IMPLANT
TAPE STRIPS DRAPE STRL (GAUZE/BANDAGES/DRESSINGS) ×1 IMPLANT
TOWEL OR 17X26 10 PK STRL BLUE (TOWEL DISPOSABLE) ×2 IMPLANT
TOWEL OR NON WOVEN STRL DISP B (DISPOSABLE) IMPLANT
TRAY FOLEY CATH 14FRSI W/METER (CATHETERS) ×2 IMPLANT
WATER STERILE IRR 1500ML POUR (IV SOLUTION) ×2 IMPLANT
WRAP KNEE MAXI GEL POST OP (GAUZE/BANDAGES/DRESSINGS) ×2 IMPLANT

## 2014-05-04 NOTE — Interval H&P Note (Signed)
History and Physical Interval Note:  05/04/2014 7:02 AM  Dominic Young  has presented today for surgery, with the diagnosis of OA RIGHT KNEE  The various methods of treatment have been discussed with the patient and family. After consideration of risks, benefits and other options for treatment, the patient has consented to  Procedure(s): RIGHT TOTAL KNEE ARTHROPLASTY (Right) as a surgical intervention .  The patient's history has been reviewed, patient examined, no change in status, stable for surgery.  I have reviewed the patient's chart and labs.  Questions were answered to the patient's satisfaction.     [name]

## 2014-05-04 NOTE — Transfer of Care (Signed)
Immediate Anesthesia Transfer of Care Note  Patient: Dominic Young  Procedure(s) Performed: Procedure(s): RIGHT TOTAL KNEE ARTHROPLASTY (Right)  Patient Location: PACU  Anesthesia Type:General and Spinal  Level of Consciousness: awake, alert , oriented and patient cooperative  Airway & Oxygen Therapy: Patient Spontanous Breathing and Patient connected to face mask oxygen  Post-op Assessment: Report given to RN and Post -op Vital signs reviewed and stable  Post vital signs: Reviewed and stable  Last Vitals:  Filed Vitals:   05/04/14 0506  BP: 144/82  Pulse: 71  Temp: 36.4 C  Resp: 18    Complications: No apparent anesthesia complications

## 2014-05-04 NOTE — Op Note (Signed)
Pre-operative diagnosis- Osteoarthritis  Right knee(s)  Post-operative diagnosis- Osteoarthritis Right knee(s)  Procedure-  Right  Total Knee Arthroplasty  Surgeon- Dione Plover. Jaston Havens, MD  Assistant- Ardeen Jourdain, PA-C   Anesthesia-  Spinal  EBL-* No blood loss amount entered *   Drains Hemovac  Tourniquet time-  Total Tourniquet Time Documented: Thigh (Right) - 35 minutes Total: Thigh (Right) - 35 minutes     Complications- None  Condition-PACU - hemodynamically stable.   Brief Clinical Note  Dominic Young is a 74 y.o. year old male with end stage OA of his right knee with progressively worsening pain and dysfunction. He has constant pain, with activity and at rest and significant functional deficits with difficulties even with ADLs. He has had extensive non-op management including analgesics, injections of cortisone and viscosupplements, and home exercise program, but remains in significant pain with significant dysfunction. Radiographs show bone on bone arthritis medial and patellofemoral. He presents now for right Total Knee Arthroplasty.    Procedure in detail---   The patient is brought into the operating room and positioned supine on the operating table. After successful administration of  Spinal,   a tourniquet is placed high on the  Right thigh(s) and the lower extremity is prepped and draped in the usual sterile fashion. Time out is performed by the operating team and then the  Right lower extremity is wrapped in Esmarch, knee flexed and the tourniquet inflated to 300 mmHg.       A midline incision is made with a ten blade through the subcutaneous tissue to the level of the extensor mechanism. A fresh blade is used to make a medial parapatellar arthrotomy. Soft tissue over the proximal medial tibia is subperiosteally elevated to the joint line with a knife and into the semimembranosus bursa with a Cobb elevator. Soft tissue over the proximal lateral tibia is elevated with  attention being paid to avoiding the patellar tendon on the tibial tubercle. The patella is everted, knee flexed 90 degrees and the ACL and PCL are removed. Findings are bone on bone medial and patellofemoral with large medial osteophytes.        The drill is used to create a starting hole in the distal femur and the canal is thoroughly irrigated with sterile saline to remove the fatty contents. The 5 degree Right  valgus alignment guide is placed into the femoral canal and the distal femoral cutting block is pinned to remove 10 mm off the distal femur. Resection is made with an oscillating saw.      The tibia is subluxed forward and the menisci are removed. The extramedullary alignment guide is placed referencing proximally at the medial aspect of the tibial tubercle and distally along the second metatarsal axis and tibial crest. The block is pinned to remove 18mm off the more deficient medial  side. Resection is made with an oscillating saw. Size 5is the most appropriate size for the tibia and the proximal tibia is prepared with the modular drill and keel punch for that size.      The femoral sizing guide is placed and size 5 is most appropriate. Rotation is marked off the epicondylar axis and confirmed by creating a rectangular flexion gap at 90 degrees. The size 5 cutting block is pinned in this rotation and the anterior, posterior and chamfer cuts are made with the oscillating saw. The intercondylar block is then placed and that cut is made.      Trial size 5 tibial component,  trial size 5 posterior stabilized femur and a 12.5  mm posterior stabilized rotating platform insert trial is placed. Full extension is achieved with excellent varus/valgus and anterior/posterior balance throughout full range of motion. The patella is everted and thickness measured to be 27  mm. Free hand resection is taken to 15 mm, a 41 template is placed, lug holes are drilled, trial patella is placed, and it tracks normally.  Osteophytes are removed off the posterior femur with the trial in place. All trials are removed and the cut bone surfaces prepared with pulsatile lavage. Cement is mixed and once ready for implantation, the size 5 tibial implant, size  5 posterior stabilized femoral component, and the size 41 patella are cemented in place and the patella is held with the clamp. The trial insert is placed and the knee held in full extension. The Exparel (20 ml mixed with 30 ml saline) and .25% Bupivicaine, are injected into the extensor mechanism, posterior capsule, medial and lateral gutters and subcutaneous tissues.  All extruded cement is removed and once the cement is hard the permanent 12.5 mm posterior stabilized rotating platform insert is placed into the tibial tray.      The wound is copiously irrigated with saline solution and the extensor mechanism closed over a hemovac drain with #1 V-loc suture. The tourniquet is released for a total tourniquet time of 32  minutes. Flexion against gravity is 140 degrees and the patella tracks normally. Subcutaneous tissue is closed with 2.0 vicryl and subcuticular with running 4.0 Monocryl. The incision is cleaned and dried and steri-strips and a bulky sterile dressing are applied. The limb is placed into a knee immobilizer and the patient is awakened and transported to recovery in stable condition.      Please note that a surgical assistant was a medical necessity for this procedure in order to perform it in a safe and expeditious manner. Surgical assistant was necessary to retract the ligaments and vital neurovascular structures to prevent injury to them and also necessary for proper positioning of the limb to allow for anatomic placement of the prosthesis.   Dione Plover Montay Vanvoorhis, MD    05/04/2014, 8:08 AM

## 2014-05-04 NOTE — Evaluation (Signed)
Physical Therapy Evaluation Patient Details Name: KERMAN PFOST MRN: [id number] DOB: April 26, 1940 Today's Date: 05/04/2014   History of Present Illness  R TKA  Clinical Impression  *Pt is s/p TKA resulting in the deficits listed below (see PT Problem List). ** Pt will benefit from skilled PT to increase their independence and safety with mobility to allow discharge to the venue listed below.   Pt ambulated 15' with RW with min/guard assist. ASsisted pt with R TKA exercises, ice applied following PT session. Pt was independently able to do SLR today. Excellent progress expected.  **    Follow Up Recommendations Home health PT    Equipment Recommendations  None recommended by PT    Recommendations for Other Services       Precautions / Restrictions Precautions Precautions: Knee Required Braces or Orthoses: Knee Immobilizer - Right Knee Immobilizer - Right: Discontinue once straight leg raise with < 10 degree lag Restrictions Weight Bearing Restrictions: No      Mobility  Bed Mobility Overal bed mobility: Needs Assistance Bed Mobility: Supine to Sit     Supine to sit: Min guard     General bed mobility comments: min/guard for RLE  Transfers Overall transfer level: Needs assistance Equipment used: Rolling walker (2 wheeled) Transfers: Sit to/from Stand Sit to Stand: Min assist;Min guard;From elevated surface         General transfer comment: cues for hand placement, bed elevated  Ambulation/Gait Ambulation/Gait assistance: Min guard Ambulation Distance (Feet): 50 Feet Assistive device: Rolling walker (2 wheeled) Gait Pattern/deviations: Step-to pattern     General Gait Details: cues to lift head and breathe, cues for sequencing and management of RW, steady, 1-2/10 pain with walking  Stairs            Wheelchair Mobility    Modified Rankin (Stroke Patients Only)       Balance                                              Pertinent Vitals/Pain Pain Assessment: 0-10 Pain Score: 2  Pain Location: R knee Pain Descriptors / Indicators: Sore Pain Intervention(s): Monitored during session;Limited activity within patient's tolerance;Ice applied;Premedicated before session    [place] expects to be discharged to:: Private residence Living Arrangements: Spouse/significant other Available Help at Discharge: Family;Available 24 hours/day   Home Access: Stairs to enter   Entrance Stairs-Number of Steps: 28   Home Equipment: Walker - 2 wheels;Bedside commode;Crutches Additional Comments: pt is living in top of renovated barn while his house is being constructed    Prior Function Level of Independence: Independent               Hand Dominance        Extremity/Trunk Assessment               Lower Extremity Assessment: RLE deficits/detail RLE Deficits / Details: SLR 3/5, ankle WNL, knee flexion AAROM 45*    Cervical / Trunk Assessment: Normal  Communication   Communication: No difficulties  Cognition Arousal/Alertness: Awake/alert Behavior During Therapy: WFL for tasks assessed/performed Overall Cognitive Status: Within Functional Limits for tasks assessed                      General Comments      Exercises Total Joint Exercises Ankle Circles/Pumps: AROM;Both;20 reps;Supine Quad Sets: AROM;Right;5 reps;Supine  Long Arc Quad: AROM;Right;Seated;10 reps      Assessment/Plan    PT Assessment Patient needs continued PT services  PT Diagnosis Acute pain;Difficulty walking   PT Problem List Decreased strength;Decreased range of motion;Decreased activity tolerance;Pain;Decreased knowledge of use of DME;Decreased mobility  PT Treatment Interventions Gait training;Stair training;Functional mobility training;Therapeutic activities;DME instruction;Therapeutic exercise;Patient/family education   PT Goals (Current goals can be found in the Care Plan section)  Acute Rehab PT Goals Patient Stated Goal: to play golf tomorrow PT Goal Formulation: With patient/family Time For Goal Achievement: 05/18/14 Potential to Achieve Goals: Good    Frequency 7X/week   Barriers to discharge        Co-evaluation               End of Session Equipment Utilized During Treatment: Gait belt Activity Tolerance: Patient tolerated treatment well Patient left: in chair;with call bell/phone within reach;with family/visitor present Nurse Communication: Mobility status         Time: 3295-1884 PT Time Calculation (min) (ACUTE ONLY): 31 min   Charges:   PT Evaluation $Initial PT Evaluation Tier I: 1 Procedure PT Treatments $Gait Training: 8-22 mins   PT G Codes:        Philomena Doheny 05/04/2014, 3:30 PM [phone]

## 2014-05-04 NOTE — Anesthesia Postprocedure Evaluation (Signed)
  Anesthesia Post-op Note  Patient: Dominic Young  Procedure(s) Performed: Procedure(s) (LRB): RIGHT TOTAL KNEE ARTHROPLASTY (Right)  Patient Location: PACU  Anesthesia Type: Spinal  Level of Consciousness: awake and alert   Airway and Oxygen Therapy: Patient Spontanous Breathing  Post-op Pain: mild  Post-op Assessment: Post-op Vital signs reviewed, Patient's Cardiovascular Status Stable, Respiratory Function Stable, Patent Airway and No signs of Nausea or vomiting  Last Vitals:  Filed Vitals:   05/04/14 1126  BP: 148/82  Pulse: 68  Temp:   Resp: 18    Post-op Vital Signs: stable   Complications: No apparent anesthesia complications

## 2014-05-04 NOTE — Anesthesia Preprocedure Evaluation (Addendum)
Anesthesia Evaluation  Patient identified by MRN, date of birth, ID band Patient awake    Reviewed: Allergy & Precautions, NPO status , Patient's Chart, lab work & pertinent test results  Airway Mallampati: II  TM Distance: >3 FB Neck ROM: Full    Dental no notable dental hx.    Pulmonary neg pulmonary ROS,  breath sounds clear to auscultation  Pulmonary exam normal       Cardiovascular hypertension, Pt. on medications negative cardio ROS  Rhythm:Regular Rate:Normal     Neuro/Psych negative neurological ROS  negative psych ROS   GI/Hepatic negative GI ROS, Neg liver ROS,   Endo/Other  negative endocrine ROS  Renal/GU negative Renal ROS  negative genitourinary   Musculoskeletal negative musculoskeletal ROS (+)   Abdominal   Peds negative pediatric ROS (+)  Hematology negative hematology ROS (+)   Anesthesia Other Findings   Reproductive/Obstetrics negative OB ROS                            Anesthesia Physical Anesthesia Plan  ASA: II  Anesthesia Plan: Spinal   Post-op Pain Management:    Induction:   Airway Management Planned: Simple Face Mask  Additional Equipment:   Intra-op Plan:   Post-operative Plan:   Informed Consent: I have reviewed the patients History and Physical, chart, labs and discussed the procedure including the risks, benefits and alternatives for the proposed anesthesia with the patient or authorized representative who has indicated his/her understanding and acceptance.   Dental advisory given  Plan Discussed with: CRNA  Anesthesia Plan Comments:         Anesthesia Quick Evaluation

## 2014-05-04 NOTE — Anesthesia Procedure Notes (Addendum)
Spinal Patient location during procedure: OR Staffing Performed by: anesthesiologist  Preanesthetic Checklist Completed: patient identified, site marked, surgical consent, pre-op evaluation, timeout performed, IV checked, risks and benefits discussed and monitors and equipment checked Spinal Block Patient position: sitting Prep: Betadine Patient monitoring: heart rate, continuous pulse ox and blood pressure Approach: right paramedian Location: L4-5 Injection technique: single-shot Needle Needle type: Spinocan  Needle gauge: 22 G Needle length: 9 cm Additional Notes Expiration date of kit checked and confirmed. Patient tolerated procedure well, without complications.    Procedure Name: LMA Insertion Date/Time: 05/04/2014 7:34 AM Performed by: Dione Booze Patient Re-evaluated:Patient Re-evaluated prior to inductionOxygen Delivery Method: Circle system utilized Preoxygenation: Pre-oxygenation with 100% oxygen Intubation Type: IV induction LMA: LMA inserted LMA Size: 5.0 Number of attempts: 1 Placement Confirmation: positive ETCO2

## 2014-05-05 ENCOUNTER — Encounter (HOSPITAL_COMMUNITY): Payer: Self-pay | Admitting: Orthopedic Surgery

## 2014-05-05 LAB — BASIC METABOLIC PANEL
Anion gap: 5 (ref 5–15)
BUN: 14 mg/dL (ref 6–23)
CO2: 26 mmol/L (ref 19–32)
Calcium: 8.2 mg/dL — ABNORMAL LOW (ref 8.4–10.5)
Chloride: 111 mmol/L (ref 96–112)
Creatinine, Ser: 0.97 mg/dL (ref 0.50–1.35)
GFR calc Af Amer: >90 mL/min (ref >90)
GFR, EST NON AFRICAN AMERICAN: 80 mL/min — AB (ref >90)
GLUCOSE: 123 mg/dL — AB (ref 70–99)
Potassium: 4 mmol/L (ref 3.5–5.1)
SODIUM: 142 mmol/L (ref 135–145)

## 2014-05-05 LAB — CBC
HEMATOCRIT: 34.9 % — AB (ref 39.0–52.0)
Hemoglobin: 11.4 g/dL — ABNORMAL LOW (ref 13.0–17.0)
MCH: 30.9 pg (ref 26.0–34.0)
MCHC: 32.7 g/dL (ref 30.0–36.0)
MCV: 94.6 fL (ref 78.0–100.0)
Platelets: 163 10*3/uL (ref 150–400)
RBC: 3.69 MIL/uL — AB (ref 4.22–5.81)
RDW: 13.5 % (ref 11.5–15.5)
WBC: 13.2 10*3/uL — ABNORMAL HIGH (ref 4.0–10.5)

## 2014-05-05 NOTE — Progress Notes (Signed)
Physical Therapy Treatment Patient Details Name: SELIG WAMPOLE MRN: [id number] DOB: [birth year] Today's Date: 05/05/2014    History of Present Illness R TKA    PT Comments    Pt progressing well, will practice stairs next session  Follow Up Recommendations  Home health PT     Equipment Recommendations  None recommended by PT    Recommendations for Other Services       Precautions / Restrictions Precautions Precautions: Knee Required Braces or Orthoses: Knee Immobilizer - Right Knee Immobilizer - Right: Discontinue once straight leg raise with < 10 degree lag Restrictions Weight Bearing Restrictions: No    Mobility  Bed Mobility Overal bed mobility: Needs Assistance Bed Mobility: Sit to Supine     Supine to sit: Supervision Sit to supine: Supervision   General bed mobility comments: for safety, pt able to self assist RLE with LLE  Transfers Overall transfer level: Needs assistance Equipment used: Rolling walker (2 wheeled) Transfers: Sit to/from Stand Sit to Stand: Min guard         General transfer comment: for safety:  cues for UE/LE placement  Ambulation/Gait Ambulation/Gait assistance: Min guard Ambulation Distance (Feet): 100 Feet Assistive device: Rolling walker (2 wheeled) Gait Pattern/deviations: Step-to pattern;Step-through pattern;Antalgic     General Gait Details: cues for sequence and wt shift  on to RLE   Stairs            Wheelchair Mobility    Modified Rankin (Stroke Patients Only)       Balance                                    Cognition Arousal/Alertness: Awake/alert Behavior During Therapy: WFL for tasks assessed/performed Overall Cognitive Status: Within Functional Limits for tasks assessed                      Exercises Total Joint Exercises Ankle Circles/Pumps: AROM;Both;20 reps;Supine Quad Sets: AROM;10 reps;Right Heel Slides: AROM;AAROM;Right;10 reps Hip ABduction/ADduction:  AROM;AAROM;Right;10 reps Straight Leg Raises: AROM;Right;Strengthening;10 reps    General Comments        Pertinent Vitals/Pain Pain Assessment: 0-10 Pain Score: 4  Faces Pain Scale: Hurts a little bit Pain Location: R knee Pain Descriptors / Indicators: Guarding;Contraction;Cramping Pain Intervention(s): Limited activity within patient's tolerance;Monitored during session;Premedicated before session;Repositioned;Ice applied    Home Living Family/patient expects to be discharged to:: Private residence Living Arrangements: Spouse/significant other Available Help at Discharge: Family;Available 24 hours/day         Home Equipment: Walker - 2 wheels;Bedside commode;Crutches      Prior Function Level of Independence: Independent          PT Goals (current goals can now be found in the care plan section) Acute Rehab PT Goals Patient Stated Goal: golf PT Goal Formulation: With patient/family Time For Goal Achievement: 05/18/14 Potential to Achieve Goals: Good Progress towards PT goals: Progressing toward goals    Frequency  7X/week    PT Plan Current plan remains appropriate    Co-evaluation             End of Session Equipment Utilized During Treatment: Gait belt Activity Tolerance: Patient tolerated treatment well Patient left: in bed;with call bell/phone within reach;with family/visitor present     Time: 1010-1046 PT Time Calculation (min) (ACUTE ONLY): 36 min  Charges:  $Gait Training: 8-22 mins $Therapeutic Exercise: 8-22 mins  G CodesKenyon Ana 05/05/2014, 11:59 AM

## 2014-05-05 NOTE — Progress Notes (Signed)
   Subjective: 1 Day Post-Op Procedure(s) (LRB): RIGHT TOTAL KNEE ARTHROPLASTY (Right) Patient reports pain as mild.   Patient seen in rounds with Dr. Wynelle Link. Doing okay today. Wife in room at bedside. Patient is well, and has had no acute complaints or problems.  Walked 50 feet yesterday following surgery. We will resume therapy today.  Plan is to go Home after hospital stay.  Objective: Vital signs in last 24 hours: Temp:  [97 F (36.1 C)-98.6 F (37 C)] 98.1 F (36.7 C) (02/02 0507) Pulse Rate:  [55-84] 62 (02/02 0507) Resp:  [10-19] 16 (02/02 0735) BP: (111-148)/(58-95) 123/63 mmHg (02/02 0507) SpO2:  [92 %-100 %] 96 % (02/02 0735)  Intake/Output from previous day:  Intake/Output Summary (Last 24 hours) at 05/05/14 0918 Last data filed at 05/05/14 0648  Gross per 24 hour  Intake 3961.68 ml  Output   3830 ml  Net 131.68 ml    Intake/Output this shift: UOP 850 since MN   Labs:  Recent Labs  05/05/14 0440  HGB 11.4*    Recent Labs  05/05/14 0440  WBC 13.2*  RBC 3.69*  HCT 34.9*  PLT 163    Recent Labs  05/05/14 0440  NA 142  K 4.0  CL 111  CO2 26  BUN 14  CREATININE 0.97  GLUCOSE 123*  CALCIUM 8.2*   No results for input(s): LABPT, INR in the last 72 hours.  EXAM General - Patient is Alert, Appropriate and Oriented Extremity - Neurovascular intact Sensation intact distally Dorsiflexion/Plantar flexion intact Dressing - dressing C/D/I Motor Function - intact, moving foot and toes well on exam.  Hemovac pulled without difficulty.  Past Medical History  Diagnosis Date  . Hypertension   . Dysrhythmia     when young had issues with heart palpitations altered diet and has not had problems since   . GERD (gastroesophageal reflux disease)     manages with diet   . Arthritis   . Hyperlipidemia   . Tremor of right hand   . Tinnitus   . 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 Dysphasia     for dilation 11/2012 / mild reflux  .  Organic erectile dysfunction   . Seasonal allergic rhinitis   . Diverticulosis     hx of   . Degenerative joint disease     Assessment/Plan: 1 Day Post-Op Procedure(s) (LRB): RIGHT TOTAL KNEE ARTHROPLASTY (Right) Principal Problem:   OA (osteoarthritis) of knee  Estimated body mass index is 25.57 kg/(m^2) as calculated from the following:   Height as of this encounter: 6\' 4"  (1.93 m).   Weight as of this encounter: 95.255 kg (210 lb). Advance diet Up with therapy Plan for discharge tomorrow Discharge home with home health  DVT Prophylaxis - Xarelto Weight-Bearing as tolerated to right leg D/C O2 and Pulse OX and try on Room Air  Arlee Muslim, PA-C Orthopaedic Surgery 05/05/2014, 9:18 AM

## 2014-05-05 NOTE — Discharge Instructions (Addendum)
° °Dr. Frank Aluisio °Total Joint Specialist °[facility] °[address] °[place], [place] [address] °[phone] ° °TOTAL KNEE REPLACEMENT POSTOPERATIVE DIRECTIONS ° ° ° °Knee Rehabilitation, Guidelines Following Surgery  °Results after knee surgery are often greatly improved when you follow the exercise, range of motion and muscle strengthening exercises prescribed by your doctor. Safety measures are also important to protect the knee from further injury. Any time any of these exercises cause you to have increased pain or swelling in your knee joint, decrease the amount until you are comfortable again and slowly increase them. If you have problems or questions, call your caregiver or physical therapist for advice.  ° °HOME CARE INSTRUCTIONS  °Remove items at home which could result in a fall. This includes throw rugs or furniture in walking pathways.  °Continue medications as instructed at time of discharge. °You may have some home medications which will be placed on hold until you complete the course of blood thinner medication.  °You may start showering once you are discharged home but do not submerge the incision under water. Just pat the incision dry and apply a dry gauze dressing on daily. °Walk with walker as instructed.  °You may resume a sexual relationship in one month or when given the OK by  your doctor.  °· Use walker as long as suggested by your caregivers. °· Avoid periods of inactivity such as sitting longer than an hour when not asleep. This helps prevent blood clots.  °You may put full weight on your legs and walk as much as is comfortable.  °You may return to work once you are cleared by your doctor.  °Do not drive a car for 6 weeks or until released by you surgeon.  °· Do not drive while taking narcotics.  °Wear the elastic stockings for three weeks following surgery during the day but you may remove then at night. °Make sure you keep all of your appointments after your  operation with all of your doctors and caregivers. You should call the office at the above phone number and make an appointment for approximately two weeks after the date of your surgery. °Change the dressing daily and reapply a dry dressing each time. °Please pick up a stool softener and laxative for home use as long as you are requiring pain medications. °· ICE to the affected knee every three hours for 30 minutes at a time and then as needed for pain and swelling.  Continue to use ice on the knee for pain and swelling from surgery. You may notice swelling that will progress down to the foot and ankle.  This is normal after surgery.  Elevate the leg when you are not up walking on it.   °It is important for you to complete the blood thinner medication as prescribed by your doctor. °· Continue to use the breathing machine which will help keep your temperature down.  It is common for your temperature to cycle up and down following surgery, especially at night when you are not up moving around and exerting yourself.  The breathing machine keeps your lungs expanded and your temperature down. ° °RANGE OF MOTION AND STRENGTHENING EXERCISES  °Rehabilitation of the knee is important following a knee injury or an operation. After just a few days of immobilization, the muscles of the thigh which control the knee become weakened and shrink (atrophy). Knee exercises are designed to build up the tone and strength of the thigh muscles and to improve knee   motion. Often times heat used for twenty to thirty minutes before working out will loosen up your tissues and help with improving the range of motion but do not use heat for the first two weeks following surgery. These exercises can be done on a training (exercise) mat, on the floor, on a table or on a bed. Use what ever works the best and is most comfortable for you Knee exercises include:  °Leg Lifts - While your knee is still immobilized in a splint or cast, you can do  straight leg raises. Lift the leg to 60 degrees, hold for 3 sec, and slowly lower the leg. Repeat 10-20 times 2-3 times daily. Perform this exercise against resistance later as your knee gets better.  °Quad and Hamstring Sets - Tighten up the muscle on the front of the thigh (Quad) and hold for 5-10 sec. Repeat this 10-20 times hourly. Hamstring sets are done by pushing the foot backward against an object and holding for 5-10 sec. Repeat as with quad sets.  °A rehabilitation program following serious knee injuries can speed recovery and prevent re-injury in the future due to weakened muscles. Contact your doctor or a physical therapist for more information on knee rehabilitation.  ° °SKILLED REHAB INSTRUCTIONS: °If the patient is transferred to a skilled rehab facility following release from the hospital, a list of the current medications will be sent to the facility for the patient to continue.  When discharged from the skilled rehab facility, please have the facility set up the patient's Home Health Physical Therapy prior to being released. Also, the skilled facility will be responsible for providing the patient with their medications at time of release from the facility to include their pain medication, the muscle relaxants, and their blood thinner medication. If the patient is still at the rehab facility at time of the two week follow up appointment, the skilled rehab facility will also need to assist the patient in arranging follow up appointment in our office and any transportation needs. ° °MAKE SURE YOU:  °Understand these instructions.  °Will watch your condition.  °Will get help right away if you are not doing well or get worse.  ° ° °Pick up stool softner and laxative for home use following surgery while on pain medications. °Do not submerge incision under water. °Please use good hand washing techniques while changing dressing each day. °May shower starting three days after surgery. °Please use a clean  towel to pat the incision dry following showers. °Continue to use ice for pain and swelling after surgery. °Do not use any lotions or creams on the incision until instructed by your surgeon. ° °Take Xarelto for two and a half more weeks, then discontinue Xarelto. °Once the patient has completed the Xarelto, they may resume the 81 mg Aspirin. ° °Postoperative Constipation Protocol ° °Constipation - defined medically as fewer than three stools per week and severe constipation as less than one stool per week. ° °One of the most common issues patients have following surgery is constipation.  Even if you have a regular bowel pattern at home, your normal regimen is likely to be disrupted due to multiple reasons following surgery.  Combination of anesthesia, postoperative narcotics, change in appetite and fluid intake all can affect your bowels.  In order to avoid complications following surgery, here are some recommendations in order to help you during your recovery period. ° °Colace (docusate) - Pick up an over-the-counter form of Colace or another stool softener and take   twice a day as long as you are requiring postoperative pain medications.  Take with a full glass of water daily.  If you experience loose stools or diarrhea, hold the colace until you stool forms back up.  If your symptoms do not get better within 1 week or if they get worse, check with your doctor.  Dulcolax (bisacodyl) - Pick up over-the-counter and take as directed by the product packaging as needed to assist with the movement of your bowels.  Take with a full glass of water.  Use this product as needed if not relieved by Colace only.   MiraLax (polyethylene glycol) - Pick up over-the-counter to have on hand.  MiraLax is a solution that will increase the amount of water in your bowels to assist with bowel movements.  Take as directed and can mix with a glass of water, juice, soda, coffee, or tea.  Take if you go more than two days without a  movement. Do not use MiraLax more than once per day. Call your doctor if you are still constipated or irregular after using this medication for 7 days in a row.  If you continue to have problems with postoperative constipation, please contact the office for further assistance and recommendations.  If you experience "the worst abdominal pain ever" or develop nausea or vomiting, please contact the office immediatly for further recommendations for treatment.    Information on my medicine - XARELTO (Rivaroxaban)  This medication education was reviewed with me or my healthcare representative as part of my discharge preparation.  The pharmacist that spoke with me during my hospital stay was:  WOFFORD, DREW A, RPH  Why was Xarelto prescribed for you? Xarelto was prescribed for you to reduce the risk of blood clots forming after orthopedic surgery. The medical term for these abnormal blood clots is venous thromboembolism (VTE).  What do you need to know about xarelto ? Take your Xarelto ONCE DAILY at the same time every day. You may take it either with or without food.  If you have difficulty swallowing the tablet whole, you may crush it and mix in applesauce just prior to taking your dose.  Take Xarelto exactly as prescribed by your doctor and DO NOT stop taking Xarelto without talking to the doctor who prescribed the medication.  Stopping without other VTE prevention medication to take the place of Xarelto may increase your risk of developing a clot.  After discharge, you should have regular check-up appointments with your healthcare provider that is prescribing your Xarelto.    What do you do if you miss a dose? If you miss a dose, take it as soon as you remember on the same day then continue your regularly scheduled once daily regimen the next day. Do not take two doses of Xarelto on the same day.   Important Safety Information A possible side effect of Xarelto is bleeding. You  should call your healthcare provider right away if you experience any of the following: ? Bleeding from an injury or your nose that does not stop. ? Unusual colored urine (red or dark brown) or unusual colored stools (red or black). ? Unusual bruising for unknown reasons. ? A serious fall or if you hit your head (even if there is no bleeding).  Some medicines may interact with Xarelto and might increase your risk of bleeding while on Xarelto. To help avoid this, consult your healthcare provider or pharmacist prior to using any new prescription or non-prescription medications, including herbals,  vitamins, non-steroidal anti-inflammatory drugs (NSAIDs) and supplements.  This website has more information on Xarelto: [email or web address]

## 2014-05-05 NOTE — Evaluation (Signed)
Occupational Therapy Evaluation Patient Details Name: Dominic Young MRN: [id number] DOB: 10-15-1940 Today's Date: 05/05/2014    History of Present Illness R TKA   Clinical Impression   This 74 year old man was admitted for the above surgery.  All education was completed.  No further OT is needed at this time.    Follow Up Recommendations  No OT follow up    Equipment Recommendations  None recommended by OT    Recommendations for Other Services       Precautions / Restrictions Precautions Precautions: Knee Restrictions Weight Bearing Restrictions: No      Mobility Bed Mobility         Supine to sit: Supervision     General bed mobility comments: HOB raised 20 degrees  Transfers   Equipment used: Rolling walker (2 wheeled) Transfers: Sit to/from Stand Sit to Stand: Min guard         General transfer comment: for safety:  cues for UE/LE placement    Balance                                            ADL Overall ADL's : Needs assistance/impaired                         Toilet Transfer: Min guard;BSC;Ambulation             General ADL Comments: Pt performed ADL from commode:  set up for UB and min A for LB, sit to stand.  He has a Secondary school teacher at home:  reviewed ADL uses.  Educated on shower sequence.  Pt does not feel he needs to practice this here--verbalizes understanding.     Vision                     Perception     Praxis      Pertinent Vitals/Pain Pain Assessment: Faces Faces Pain Scale: Hurts a little bit Pain Location: R knee Pain Descriptors / Indicators: Sore Pain Intervention(s): Limited activity within patient's tolerance;Monitored during session;Premedicated before session;Repositioned;Ice applied     Hand Dominance     Extremity/Trunk Assessment Upper Extremity Assessment Upper Extremity Assessment: Overall WFL for tasks assessed           Communication Communication Communication:  No difficulties   Cognition Arousal/Alertness: Awake/alert Behavior During Therapy: WFL for tasks assessed/performed Overall Cognitive Status: Within Functional Limits for tasks assessed                     General Comments       Exercises       Shoulder Instructions      Home Living Family/patient expects to be discharged to:: Private residence Living Arrangements: Spouse/significant other Available Help at Discharge: Family;Available 24 hours/day               Bathroom Shower/Tub: Occupational psychologist: Standard     Home Equipment: Environmental consultant - 2 wheels;Bedside commode;Crutches          Prior Functioning/Environment Level of Independence: Independent             OT Diagnosis: Generalized weakness   OT Problem List:     OT Treatment/Interventions:      OT Goals(Current goals can be found in the care plan section) Acute Rehab OT Goals  Patient Stated Goal: golf  OT Frequency:     Barriers to D/C:            Co-evaluation              End of Session CPM Right Knee CPM Right Knee: Off  Activity Tolerance: Patient tolerated treatment well Patient left: in chair;with call bell/phone within reach;with family/visitor present   Time: 0813-0849 OT Time Calculation (min): 36 min Charges:  OT General Charges $OT Visit: 1 Procedure OT Evaluation $Initial OT Evaluation Tier I: 1 Procedure OT Treatments $Self Care/Home Management : 8-22 mins G-Codes:    Dominic Young 05/15/14, 8:54 AM  Lesle Chris, OTR/L [phone] 15-May-2014

## 2014-05-05 NOTE — Care Management Note (Signed)
    Page 1 of 1   05/05/2014     2:07:48 PM CARE MANAGEMENT NOTE 05/05/2014  Patient:  ANDREWJAMES, WEIRAUCH   Account Number:  [id number]  Date Initiated:  05/05/2014  Documentation initiated by:  [facility]  Subjective/Objective Assessment:   adm: RIGHT TOTAL KNEE ARTHROPLASTY (Right)     Action/Plan:   discharge planning   Anticipated DC Date:  05/06/2014   Anticipated DC Plan:  [place]  CM consult      [facility] Choice  HOME HEALTH   Choice offered to / List presented to:  C-1 Patient        [place] arranged  HH-2 PT      [place]   Status of service:  Completed, signed off Medicare Important Message given?   (If response is "NO", the following Medicare IM given date fields will be blank) Date Medicare IM given:   Medicare IM given by:   Date Additional Medicare IM given:   Additional Medicare IM given by:    Discharge Disposition:  [place]  Per UR Regulation:    If discussed at Long Length of Stay Meetings, dates discussed:    Comments:  05/05/14 14:00 CM met with pt in room to offer choice of home health agency.  Pt chooses [facility] to render HHPT. No DME is needed. Address and contact information verified. referral given to [facility] rep, tim.  No other CM needs were communicated. Mariane Masters, BSN, Cm [phone].

## 2014-05-05 NOTE — Progress Notes (Signed)
   05/05/14 1200  PT Visit Information  Last PT Received On 05/05/14  Assistance Needed +1  History of Present Illness R TKA  PT Time Calculation  PT Start Time (ACUTE ONLY) 1232  PT Stop Time (ACUTE ONLY) 1257  PT Time Calculation (min) (ACUTE ONLY) 25 min  Subjective Data  Patient Stated Goal golf  Precautions  Precautions Knee  Precaution Comments I SLR  Restrictions  Other Position/Activity Restrictions WBAT  Pain Assessment  Pain Assessment 0-10  Pain Score 3  Pain Location right knee  Pain Descriptors / Indicators Sore  Pain Intervention(s) Monitored during session;Premedicated before session  Cognition  Arousal/Alertness Awake/alert  Behavior During Therapy WFL for tasks assessed/performed  Overall Cognitive Status Within Functional Limits for tasks assessed  Bed Mobility  Overal bed mobility Needs Assistance  Bed Mobility Supine to Sit;Sit to Supine  Supine to sit Supervision  Sit to supine Supervision;Min guard  General bed mobility comments pt able to self assist RLE with LLE, min/guard to bring RLE onto bed  Transfers  Equipment used Rolling walker (2 wheeled)  Transfers Sit to/from Stand  Sit to Stand Min guard  General transfer comment for safety:  cues for UE/LE placement  Ambulation/Gait  Ambulation/Gait assistance Min guard  Ambulation Distance (Feet) 120 Feet  Assistive device Rolling walker (2 wheeled)  Gait Pattern/deviations Step-through pattern;Step-to pattern;Antalgic  General Gait Details cues for sequence and wt shift on to RLE  Stairs Yes  Stairs assistance Min guard  Stair Management One rail Left;Forwards;With crutches;Step to pattern  Number of Stairs 10  General stair comments cues for sequence  Total Joint Exercises  Knee Flexion AAROM;Seated;5 reps;Right  PT - End of Session  Equipment Utilized During Treatment Gait belt  Activity Tolerance Patient tolerated treatment well  Patient left in bed;with call bell/phone within reach;with  family/visitor present  Nurse Communication Mobility status  PT - Assessment/Plan  PT Plan Current plan remains appropriate  PT Frequency (ACUTE ONLY) 7X/week  Follow Up Recommendations Home health PT  PT equipment None recommended by PT  PT Goal Progression  Progress towards PT goals Progressing toward goals  Acute Rehab PT Goals  PT Goal Formulation With patient/family  Time For Goal Achievement 05/18/14  Potential to Achieve Goals Good  PT General Charges  $$ ACUTE PT VISIT 1 Procedure  PT Treatments  $Gait Training 23-37 mins

## 2014-05-05 NOTE — Progress Notes (Signed)
Utilization review completed.  

## 2014-05-06 LAB — CBC
HEMATOCRIT: 35 % — AB (ref 39.0–52.0)
Hemoglobin: 11.4 g/dL — ABNORMAL LOW (ref 13.0–17.0)
MCH: 30.6 pg (ref 26.0–34.0)
MCHC: 32.6 g/dL (ref 30.0–36.0)
MCV: 94.1 fL (ref 78.0–100.0)
PLATELETS: 166 10*3/uL (ref 150–400)
RBC: 3.72 MIL/uL — ABNORMAL LOW (ref 4.22–5.81)
RDW: 13.9 % (ref 11.5–15.5)
WBC: 12.3 10*3/uL — ABNORMAL HIGH (ref 4.0–10.5)

## 2014-05-06 LAB — BASIC METABOLIC PANEL
ANION GAP: 3 — AB (ref 5–15)
BUN: 14 mg/dL (ref 6–23)
CO2: 28 mmol/L (ref 19–32)
Calcium: 8.6 mg/dL (ref 8.4–10.5)
Chloride: 106 mmol/L (ref 96–112)
Creatinine, Ser: 0.97 mg/dL (ref 0.50–1.35)
GFR calc Af Amer: >90 mL/min (ref >90)
GFR calc non Af Amer: 80 mL/min — ABNORMAL LOW (ref >90)
GLUCOSE: 119 mg/dL — AB (ref 70–99)
Potassium: 4 mmol/L (ref 3.5–5.1)
Sodium: 137 mmol/L (ref 135–145)

## 2014-05-06 MED ORDER — RIVAROXABAN 10 MG PO TABS: 10.0000 mg | ORAL_TABLET | Freq: Every day | ORAL | Status: DC

## 2014-05-06 MED ORDER — OXYCODONE HCL 5 MG PO TABS: 5.0000 mg | ORAL_TABLET | ORAL | Status: DC | PRN

## 2014-05-06 MED ORDER — METHOCARBAMOL 500 MG PO TABS: 500.0000 mg | ORAL_TABLET | Freq: Four times a day (QID) | ORAL | Status: DC | PRN

## 2014-05-06 MED ORDER — TRAMADOL HCL 50 MG PO TABS: 50.0000 mg | ORAL_TABLET | Freq: Four times a day (QID) | ORAL | Status: DC | PRN

## 2014-05-06 NOTE — Progress Notes (Signed)
Physical Therapy Treatment Patient Details Name: BHAVESH VAZQUEZ MRN: [id number] DOB: Oct 26, 1940 Today's Date: 05/06/2014    History of Present Illness R TKA    PT Comments    Progressing well; comfortable with stair technique done yesterday, did not want to practice again;   Follow Up Recommendations  Home health PT     Equipment Recommendations  None recommended by PT    Recommendations for Other Services       Precautions / Restrictions Precautions Precautions: Knee Precaution Comments: I SLR Restrictions Weight Bearing Restrictions: No Other Position/Activity Restrictions: WBAT    Mobility  Bed Mobility Overal bed mobility: Needs Assistance Bed Mobility: Supine to Sit;Sit to Supine     Supine to sit: Supervision Sit to supine: Supervision;Min guard   General bed mobility comments:  pt able to self assist RLE with LLE, min/guard to bring RLE onto bed  Transfers Overall transfer level: Needs assistance Equipment used: Rolling walker (2 wheeled) Transfers: Sit to/from Stand Sit to Stand: Min guard;Supervision         General transfer comment: for safety:  cues for UE/LE placement  Ambulation/Gait Ambulation/Gait assistance: Supervision Ambulation Distance (Feet): 90 Feet Assistive device: Rolling walker (2 wheeled) Gait Pattern/deviations: Antalgic;Step-to pattern     General Gait Details: cues for sequence and wt shift on to RLE   Stairs            Wheelchair Mobility    Modified Rankin (Stroke Patients Only)       Balance                                    Cognition Arousal/Alertness: Awake/alert Behavior During Therapy: WFL for tasks assessed/performed Overall Cognitive Status: Within Functional Limits for tasks assessed                      Exercises Total Joint Exercises Ankle Circles/Pumps: AROM;Both;20 reps;Supine Quad Sets: AROM;10 reps;Right Heel Slides: AROM;AAROM;Right;10 reps Hip  ABduction/ADduction: AROM;AAROM;Right;10 reps Straight Leg Raises: AROM;Right;Strengthening;10 reps Knee Flexion: AAROM;Seated;5 reps;Right Goniometric ROM: -8 to 70* flexion    General Comments        Pertinent Vitals/Pain Pain Assessment: 0-10 Pain Score: 5  Pain Location: right knee with flexion Pain Descriptors / Indicators: Sore Pain Intervention(s): Limited activity within patient's tolerance;Monitored during session;Premedicated before session;Ice applied;Repositioned    Home Living                      Prior Function            PT Goals (current goals can now be found in the care plan section) Acute Rehab PT Goals Patient Stated Goal: golf PT Goal Formulation: With patient/family Time For Goal Achievement: 05/18/14 Potential to Achieve Goals: Good Progress towards PT goals: Progressing toward goals    Frequency  7X/week    PT Plan Current plan remains appropriate    Co-evaluation             End of Session   Activity Tolerance: Patient tolerated treatment well Patient left: in bed;with call bell/phone within reach;with family/visitor present     Time: 3710-6269 PT Time Calculation (min) (ACUTE ONLY): 44 min  Charges:  $Gait Training: 8-22 mins $Therapeutic Exercise: 23-37 mins                    G Codes:      [employer]  05/06/2014, 11:20 AM

## 2014-05-06 NOTE — Discharge Summary (Signed)
Physician Discharge Summary   Patient ID: Dominic Young MRN: [id number] DOB/AGE: [birth year] 74 y.o.  Admit date: 05/04/2014 Discharge date: 05/06/2014  Primary Diagnosis:  Osteoarthritis Right knee(s)  Admission Diagnoses:  Past Medical History  Diagnosis Date  . Hypertension   . Dysrhythmia     when young had issues with heart palpitations altered diet and has not had problems since   . GERD (gastroesophageal reflux disease)     manages with diet   . Arthritis   . Hyperlipidemia   . Tremor of right hand   . Tinnitus   . 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 Dysphasia     for dilation 11/2012 / mild reflux  . Organic erectile dysfunction   . Seasonal allergic rhinitis   . Diverticulosis     hx of   . Degenerative joint disease    Discharge Diagnoses:   Principal Problem:   OA (osteoarthritis) of knee  Estimated body mass index is 25.57 kg/(m^2) as calculated from the following:   Height as of this encounter: $RemoveBeforeD'6\' 4"'[id number]$  (1.93 m).   Weight as of this encounter: 95.255 kg (210 lb).  Procedure:  Procedure(s) (LRB): RIGHT TOTAL KNEE ARTHROPLASTY (Right)   Consults: None  HPI: Dominic Young is a 74 y.o. year old male with end stage OA of his right knee with progressively worsening pain and dysfunction. He has constant pain, with activity and at rest and significant functional deficits with difficulties even with ADLs. He has had extensive non-op management including analgesics, injections of cortisone and viscosupplements, and home exercise program, but remains in significant pain with significant dysfunction. Radiographs show bone on bone arthritis medial and patellofemoral. He presents now for right Total Knee Arthroplasty.   Laboratory Data: Admission on 05/04/2014, Discharged on 05/06/2014  Component Date Value Ref Range Status  . WBC 05/05/2014 13.2* 4.0 - 10.5 K/uL Final  . RBC 05/05/2014 3.69* 4.22 - 5.81 MIL/uL Final  . Hemoglobin 05/05/2014 11.4* 13.0 -  17.0 g/dL Final  . HCT 05/05/2014 34.9* 39.0 - 52.0 % Final  . MCV 05/05/2014 94.6  78.0 - 100.0 fL Final  . MCH 05/05/2014 30.9  26.0 - 34.0 pg Final  . MCHC 05/05/2014 32.7  30.0 - 36.0 g/dL Final  . RDW 05/05/2014 13.5  11.5 - 15.5 % Final  . Platelets 05/05/2014 163  150 - 400 K/uL Final  . Sodium 05/05/2014 142  135 - 145 mmol/L Final  . Potassium 05/05/2014 4.0  3.5 - 5.1 mmol/L Final  . Chloride 05/05/2014 111  96 - 112 mmol/L Final  . CO2 05/05/2014 26  19 - 32 mmol/L Final  . Glucose, Bld 05/05/2014 123* 70 - 99 mg/dL Final  . BUN 05/05/2014 14  6 - 23 mg/dL Final  . Creatinine, Ser 05/05/2014 0.97  0.50 - 1.35 mg/dL Final  . Calcium 05/05/2014 8.2* 8.4 - 10.5 mg/dL Final  . GFR calc non Af Amer 05/05/2014 80* >90 mL/min Final  . GFR calc Af Amer 05/05/2014 >90  >90 mL/min Final   Comment: (NOTE) The eGFR has been calculated using the CKD EPI equation. This calculation has not been validated in all clinical situations. eGFR's persistently <90 mL/min signify possible Chronic Kidney Disease.   . Anion gap 05/05/2014 5  5 - 15 Final  . WBC 05/06/2014 12.3* 4.0 - 10.5 K/uL Final  . RBC 05/06/2014 3.72* 4.22 - 5.81 MIL/uL Final  . Hemoglobin 05/06/2014 11.4*  13.0 - 17.0 g/dL Final  . HCT 05/06/2014 35.0* 39.0 - 52.0 % Final  . MCV 05/06/2014 94.1  78.0 - 100.0 fL Final  . MCH 05/06/2014 30.6  26.0 - 34.0 pg Final  . MCHC 05/06/2014 32.6  30.0 - 36.0 g/dL Final  . RDW 05/06/2014 13.9  11.5 - 15.5 % Final  . Platelets 05/06/2014 166  150 - 400 K/uL Final  . Sodium 05/06/2014 137  135 - 145 mmol/L Final  . Potassium 05/06/2014 4.0  3.5 - 5.1 mmol/L Final  . Chloride 05/06/2014 106  96 - 112 mmol/L Final  . CO2 05/06/2014 28  19 - 32 mmol/L Final  . Glucose, Bld 05/06/2014 119* 70 - 99 mg/dL Final  . BUN 05/06/2014 14  6 - 23 mg/dL Final  . Creatinine, Ser 05/06/2014 0.97  0.50 - 1.35 mg/dL Final  . Calcium 05/06/2014 8.6  8.4 - 10.5 mg/dL Final  . GFR calc non Af Amer  05/06/2014 80* >90 mL/min Final  . GFR calc Af Amer 05/06/2014 >90  >90 mL/min Final   Comment: (NOTE) The eGFR has been calculated using the CKD EPI equation. This calculation has not been validated in all clinical situations. eGFR's persistently <90 mL/min signify possible Chronic Kidney Disease.   Georgiann Hahn gap 05/06/2014 3* 5 - 15 Final  Hospital Outpatient Visit on 05/01/2014  Component Date Value Ref Range Status  . MRSA, PCR 05/01/2014 NEGATIVE  NEGATIVE Final  . Staphylococcus aureus 05/01/2014 NEGATIVE  NEGATIVE Final   Comment:        The Xpert SA Assay (FDA approved for NASAL specimens in patients over 58 years of age), is one component of a comprehensive surveillance program.  Test performance has been validated by [facility] for patients greater than or equal to 42 year old. It is not intended to diagnose infection nor to guide or monitor treatment.   Marland Kitchen aPTT 05/01/2014 28  24 - 37 seconds Final  . Prothrombin Time 05/01/2014 13.1  11.6 - 15.2 seconds Final  . INR 05/01/2014 0.98  0.00 - 1.49 Final  . ABO/RH(D) 05/01/2014 A POS   Final  . Antibody Screen 05/01/2014 NEG   Final  . Sample Expiration 05/01/2014 05/07/2014   Final  . ABO/RH(D) 05/01/2014 A POS   Final     X-Rays:No results found.  EKG:No orders found for this or any previous visit.   Hospital Course: Dominic Young is a 74 y.o. who was admitted to [facility]. They were brought to the operating room on 05/04/2014 and underwent Procedure(s): RIGHT TOTAL KNEE ARTHROPLASTY.  Patient tolerated the procedure well and was later transferred to the recovery room and then to the orthopaedic floor for postoperative care.  They were given PO and IV analgesics for pain control following their surgery.  They were given 24 hours of postoperative antibiotics of  Anti-infectives    Start     Dose/Rate Route Frequency Ordered Stop   05/04/14 1400  ceFAZolin (ANCEF) IVPB 2 g/50 mL premix     2 g100 mL/hr over  30 Minutes Intravenous Every 6 hours 05/04/14 1028 05/04/14 2031   05/04/14 0503  ceFAZolin (ANCEF) IVPB 2 g/50 mL premix     2 g100 mL/hr over 30 Minutes Intravenous On call to O.R. 05/04/14 [id number] 05/04/14 0723     and started on DVT prophylaxis in the form of Xarelto.   PT and OT were ordered for total joint protocol.  Discharge planning consulted to  help with postop disposition and equipment needs.  Patient had a very good night on the evening of surgery walking about 50 feet the evening of surgery.  They started to get up OOB with therapy on day one. Hemovac drain was pulled without difficulty.  Continued to work with therapy into day two.  Dressing was changed on day two and the incision was healing well. Patient was seen in rounds and was ready to go home.  Discharge home with home health Diet - Cardiac diet Follow up - in 2 weeks Activity - WBAT Disposition - Home Condition Upon Discharge - Good D/C Meds - See DC Summary DVT Prophylaxis - Xarelto      Discharge Instructions    Call MD / Call 911    Complete by:  As directed   If you experience chest pain or shortness of breath, CALL 911 and be transported to the hospital emergency room.  If you develope a fever above 101 F, pus (white drainage) or increased drainage or redness at the wound, or calf pain, call your surgeon's office.     Change dressing    Complete by:  As directed   Change dressing daily with sterile 4 x 4 inch gauze dressing and apply TED hose. Do not submerge the incision under water.     Constipation Prevention    Complete by:  As directed   Drink plenty of fluids.  Prune juice may be helpful.  You may use a stool softener, such as Colace (over the counter) 100 mg twice a day.  Use MiraLax (over the counter) for constipation as needed.     Diet - low sodium heart healthy    Complete by:  As directed      Discharge instructions    Complete by:  As directed   Pick up stool softner and laxative for home use  following surgery while on pain medications. Do not submerge incision under water. Please use good hand washing techniques while changing dressing each day. May shower starting three days after surgery. Please use a clean towel to pat the incision dry following showers. Continue to use ice for pain and swelling after surgery. Do not use any lotions or creams on the incision until instructed by your surgeon. Take Xarelto for two and a half more weeks, then discontinue Xarelto. Once the patient has completed the Xarelto, they may resume the 81 mg Aspirin.    Postoperative Constipation Protocol  Constipation - defined medically as fewer than three stools per week and severe constipation as less than one stool per week.  One of the most common issues patients have following surgery is constipation.  Even if you have a regular bowel pattern at home, your normal regimen is likely to be disrupted due to multiple reasons following surgery.  Combination of anesthesia, postoperative narcotics, change in appetite and fluid intake all can affect your bowels.  In order to avoid complications following surgery, here are some recommendations in order to help you during your recovery period.  Colace (docusate) - Pick up an over-the-counter form of Colace or another stool softener and take twice a day as long as you are requiring postoperative pain medications.  Take with a full glass of water daily.  If you experience loose stools or diarrhea, hold the colace until you stool forms back up.  If your symptoms do not get better within 1 week or if they get worse, check with your doctor.  Dulcolax (bisacodyl) - Pick  up over-the-counter and take as directed by the product packaging as needed to assist with the movement of your bowels.  Take with a full glass of water.  Use this product as needed if not relieved by Colace only.   MiraLax (polyethylene glycol) - Pick up over-the-counter to have on hand.  MiraLax is a  solution that will increase the amount of water in your bowels to assist with bowel movements.  Take as directed and can mix with a glass of water, juice, soda, coffee, or tea.  Take if you go more than two days without a movement. Do not use MiraLax more than once per day. Call your doctor if you are still constipated or irregular after using this medication for 7 days in a row.  If you continue to have problems with postoperative constipation, please contact the office for further assistance and recommendations.  If you experience "the worst abdominal pain ever" or develop nausea or vomiting, please contact the office immediatly for further recommendations for treatment.     Do not put a pillow under the knee. Place it under the heel.    Complete by:  As directed      Do not sit on low chairs, stoools or toilet seats, as it may be difficult to get up from low surfaces    Complete by:  As directed      Driving restrictions    Complete by:  As directed   No driving until released by the physician.     Increase activity slowly as tolerated    Complete by:  As directed      Lifting restrictions    Complete by:  As directed   No lifting until released by the physician.     Patient may shower    Complete by:  As directed   You may shower without a dressing once there is no drainage.  Do not wash over the wound.  If drainage remains, do not shower until drainage stops.     TED hose    Complete by:  As directed   Use stockings (TED hose) for 3 weeks on both leg(s).  You may remove them at night for sleeping.     Weight bearing as tolerated    Complete by:  As directed             Medication List    STOP taking these medications        aspirin EC 81 MG tablet     ibuprofen 200 MG tablet  Commonly known as:  ADVIL,MOTRIN      TAKE these medications        losartan 50 MG tablet  Commonly known as:  COZAAR  Take 50 mg by mouth every morning.     methocarbamol 500 MG tablet    Commonly known as:  ROBAXIN  Take 1 tablet (500 mg total) by mouth every 6 (six) hours as needed for muscle spasms.     oxyCODONE 5 MG immediate release tablet  Commonly known as:  Oxy IR/ROXICODONE  Take 1-2 tablets (5-10 mg total) by mouth every 3 (three) hours as needed for moderate pain, severe pain or breakthrough pain.     pravastatin 40 MG tablet  Commonly known as:  PRAVACHOL  Take 40 mg by mouth daily.     rivaroxaban 10 MG Tabs tablet  Commonly known as:  XARELTO  - Take 1 tablet (10 mg total) by mouth daily with breakfast. Take Xarelto  for two and a half more weeks, then discontinue Xarelto.  - Once the patient has completed the Xarelto, they may resume the 81 mg Aspirin.     traMADol 50 MG tablet  Commonly known as:  ULTRAM  Take 1-2 tablets (50-100 mg total) by mouth every 6 (six) hours as needed (mild pain).       Follow-up Information    Follow up with [facility].   Why:  home health physical therapy   Contact information:   [place]  [address] [phone]       Follow up with [name], MD. Schedule an appointment as soon as possible for a visit on 05/19/2014.   Specialty:  Orthopedic Surgery   Why:  Call office at [phone] for appointment on Tuesday 05/19/2014   Contact information:   [address] [place] [address] [phone]       Signed: Arlee Muslim, PA-C Orthopaedic Surgery 05/12/2014, 10:11 AM

## 2014-05-06 NOTE — Progress Notes (Signed)
   Subjective: 2 Days Post-Op Procedure(s) (LRB): RIGHT TOTAL KNEE ARTHROPLASTY (Right) Patient reports pain as mild.   Patient seen in rounds with Dr. Wynelle Link. Wife in room. Patient is well, and has had no acute complaints or problems Patient is ready to go home  Objective: Vital signs in last 24 hours: Temp:  [97.6 F (36.4 C)-98.7 F (37.1 C)] 98.5 F (36.9 C) (02/03 0533) Pulse Rate:  [72-79] 72 (02/03 0533) Resp:  [16-18] 16 (02/03 0533) BP: (141-157)/(61-71) 146/68 mmHg (02/03 0533) SpO2:  [96 %-98 %] 97 % (02/03 0533)  Intake/Output from previous day:  Intake/Output Summary (Last 24 hours) at 05/06/14 0748 Last data filed at 05/06/14 0535  Gross per 24 hour  Intake 494.17 ml  Output   1950 ml  Net -1455.83 ml    Labs:  Recent Labs  05/05/14 0440 05/06/14 0527  HGB 11.4* 11.4*    Recent Labs  05/05/14 0440 05/06/14 0527  WBC 13.2* 12.3*  RBC 3.69* 3.72*  HCT 34.9* 35.0*  PLT 163 166    Recent Labs  05/05/14 0440 05/06/14 0527  NA 142 137  K 4.0 4.0  CL 111 106  CO2 26 28  BUN 14 14  CREATININE 0.97 0.97  GLUCOSE 123* 119*  CALCIUM 8.2* 8.6   No results for input(s): LABPT, INR in the last 72 hours.  EXAM: General - Patient is Alert, Appropriate and Oriented Extremity - Neurovascular intact Sensation intact distally Dorsiflexion/Plantar flexion intact Incision - clean, dry, no drainage Motor Function - intact, moving foot and toes well on exam.   Assessment/Plan: 2 Days Post-Op Procedure(s) (LRB): RIGHT TOTAL KNEE ARTHROPLASTY (Right) Procedure(s) (LRB): RIGHT TOTAL KNEE ARTHROPLASTY (Right) Past Medical History  Diagnosis Date  . Hypertension   . Dysrhythmia     when young had issues with heart palpitations altered diet and has not had problems since   . GERD (gastroesophageal reflux disease)     manages with diet   . Arthritis   . Hyperlipidemia   . Tremor of right hand   . Tinnitus   . Hearing loss     more in left ear  than right   . Dysphasia     for dilation 11/2012 / mild reflux  . Organic erectile dysfunction   . Seasonal allergic rhinitis   . Diverticulosis     hx of   . Degenerative joint disease    Principal Problem:   OA (osteoarthritis) of knee  Estimated body mass index is 25.57 kg/(m^2) as calculated from the following:   Height as of this encounter: 6\' 4"  (1.93 m).   Weight as of this encounter: 95.255 kg (210 lb). Up with therapy Discharge home with home health Diet - Cardiac diet Follow up - in 2 weeks Activity - WBAT Disposition - Home Condition Upon Discharge - Good D/C Meds - See DC Summary DVT Prophylaxis - [place], PA-C Orthopaedic Surgery 05/06/2014, 7:48 AM

## 2014-05-12 ENCOUNTER — Emergency Department (HOSPITAL_COMMUNITY): Payer: Medicare Other

## 2014-05-12 ENCOUNTER — Emergency Department (HOSPITAL_COMMUNITY)
Admission: EM | Admit: 2014-05-12 | Discharge: 2014-05-12 | Disposition: A | Discharge status: Home / Self Care | Payer: Medicare Other | Attending: Emergency Medicine | Admitting: Emergency Medicine

## 2014-05-12 ENCOUNTER — Encounter (HOSPITAL_COMMUNITY): Payer: Self-pay

## 2014-05-12 DIAGNOSIS — G8918 Other acute postprocedural pain: Secondary | ICD-10-CM

## 2014-05-12 DIAGNOSIS — Z87438 Personal history of other diseases of male genital organs: Secondary | ICD-10-CM | POA: Diagnosis not present

## 2014-05-12 DIAGNOSIS — M1990 Unspecified osteoarthritis, unspecified site: Secondary | ICD-10-CM | POA: Diagnosis not present

## 2014-05-12 DIAGNOSIS — Z7901 Long term (current) use of anticoagulants: Secondary | ICD-10-CM | POA: Diagnosis not present

## 2014-05-12 DIAGNOSIS — R0602 Shortness of breath: Secondary | ICD-10-CM | POA: Insufficient documentation

## 2014-05-12 DIAGNOSIS — I10 Essential (primary) hypertension: Secondary | ICD-10-CM | POA: Insufficient documentation

## 2014-05-12 DIAGNOSIS — R55 Syncope and collapse: Secondary | ICD-10-CM | POA: Diagnosis not present

## 2014-05-12 DIAGNOSIS — R630 Anorexia: Secondary | ICD-10-CM | POA: Insufficient documentation

## 2014-05-12 DIAGNOSIS — R5383 Other fatigue: Secondary | ICD-10-CM | POA: Insufficient documentation

## 2014-05-12 DIAGNOSIS — Z79899 Other long term (current) drug therapy: Secondary | ICD-10-CM | POA: Diagnosis not present

## 2014-05-12 DIAGNOSIS — E860 Dehydration: Secondary | ICD-10-CM | POA: Insufficient documentation

## 2014-05-12 DIAGNOSIS — R600 Localized edema: Secondary | ICD-10-CM | POA: Insufficient documentation

## 2014-05-12 DIAGNOSIS — Z8719 Personal history of other diseases of the digestive system: Secondary | ICD-10-CM | POA: Insufficient documentation

## 2014-05-12 DIAGNOSIS — L03115 Cellulitis of right lower limb: Secondary | ICD-10-CM | POA: Diagnosis not present

## 2014-05-12 DIAGNOSIS — E785 Hyperlipidemia, unspecified: Secondary | ICD-10-CM | POA: Diagnosis not present

## 2014-05-12 DIAGNOSIS — I499 Cardiac arrhythmia, unspecified: Secondary | ICD-10-CM | POA: Diagnosis not present

## 2014-05-12 DIAGNOSIS — R21 Rash and other nonspecific skin eruption: Secondary | ICD-10-CM | POA: Insufficient documentation

## 2014-05-12 DIAGNOSIS — H9190 Unspecified hearing loss, unspecified ear: Secondary | ICD-10-CM | POA: Diagnosis not present

## 2014-05-12 DIAGNOSIS — R42 Dizziness and giddiness: Secondary | ICD-10-CM | POA: Diagnosis present

## 2014-05-12 LAB — BASIC METABOLIC PANEL
ANION GAP: 8 (ref 5–15)
BUN: 15 mg/dL (ref 6–23)
CALCIUM: 8.1 mg/dL — AB (ref 8.4–10.5)
CO2: 24 mmol/L (ref 19–32)
Chloride: 102 mmol/L (ref 96–112)
Creatinine, Ser: 0.89 mg/dL (ref 0.50–1.35)
GFR calc Af Amer: >90 mL/min (ref >90)
GFR calc non Af Amer: 83 mL/min — ABNORMAL LOW (ref >90)
GLUCOSE: 99 mg/dL (ref 70–99)
Potassium: 3.6 mmol/L (ref 3.5–5.1)
Sodium: 134 mmol/L — ABNORMAL LOW (ref 135–145)

## 2014-05-12 LAB — CBC WITH DIFFERENTIAL/PLATELET
BASOS PCT: 0 % (ref 0–1)
Basophils Absolute: 0 10*3/uL (ref 0.0–0.1)
Eosinophils Absolute: 0.1 10*3/uL (ref 0.0–0.7)
Eosinophils Relative: 1 % (ref 0–5)
HCT: 31.6 % — ABNORMAL LOW (ref 39.0–52.0)
Hemoglobin: 10.2 g/dL — ABNORMAL LOW (ref 13.0–17.0)
Lymphocytes Relative: 14 % (ref 12–46)
Lymphs Abs: 1.4 10*3/uL (ref 0.7–4.0)
MCH: 30.4 pg (ref 26.0–34.0)
MCHC: 32.3 g/dL (ref 30.0–36.0)
MCV: 94 fL (ref 78.0–100.0)
Monocytes Absolute: 1.2 10*3/uL — ABNORMAL HIGH (ref 0.1–1.0)
Monocytes Relative: 12 % (ref 3–12)
NEUTROS ABS: 7.3 10*3/uL (ref 1.7–7.7)
NEUTROS PCT: 73 % (ref 43–77)
Platelets: 360 10*3/uL (ref 150–400)
RBC: 3.36 MIL/uL — ABNORMAL LOW (ref 4.22–5.81)
RDW: 13.5 % (ref 11.5–15.5)
WBC: 10.1 10*3/uL (ref 4.0–10.5)

## 2014-05-12 LAB — I-STAT TROPONIN, ED: Troponin i, poc: 0 ng/mL (ref 0.00–0.08)

## 2014-05-12 LAB — I-STAT CG4 LACTIC ACID, ED: LACTIC ACID, VENOUS: 1.53 mmol/L (ref 0.5–2.0)

## 2014-05-12 LAB — TROPONIN I: Troponin I: <0.03 ng/mL (ref <0.031)

## 2014-05-12 MED ORDER — TRAMADOL HCL 50 MG PO TABS
100.0000 mg | ORAL_TABLET | Freq: Once | ORAL | Status: AC
  Administered 2014-05-12: 100 mg via ORAL
  Filled 2014-05-12: qty 2

## 2014-05-12 MED ORDER — MORPHINE SULFATE 4 MG/ML IJ SOLN
6.0000 mg | Freq: Once | INTRAMUSCULAR | Status: DC
  Filled 2014-05-12: qty 2

## 2014-05-12 MED ORDER — DOXYCYCLINE HYCLATE 100 MG PO CAPS: 100.0000 mg | ORAL_CAPSULE | Freq: Two times a day (BID) | ORAL | Status: DC

## 2014-05-12 MED ORDER — SODIUM CHLORIDE 0.9 % IV BOLUS (SEPSIS)
1000.0000 mL | Freq: Once | INTRAVENOUS | Status: AC
  Administered 2014-05-12: 1000 mL via INTRAVENOUS

## 2014-05-12 MED ORDER — IOHEXOL 350 MG/ML SOLN
100.0000 mL | Freq: Once | INTRAVENOUS | Status: AC | PRN
  Administered 2014-05-12: 100 mL via INTRAVENOUS

## 2014-05-12 NOTE — ED Notes (Signed)
Per EMS, pt from home.  Pt had knee replacement on 2/1.  Pt has been doing home exercises.  After exercising he became light headed.  Sat back down.  Did not pass out.  No chest pain.  Pt was pale during episodes.  No n/v.  No diarrhea.  Slight constipation.  Pt is taking home meds.  Pt did take his morning dose pain meds.  Pt lying down sbp 120's.  Pt sitting up, hr diminished but bp increased to 140 and pt became pale.  Pt has LAC.  250cc NS in route.  EKG wnl.  After 250cc, movement tolerated.  Normal urine output.  Vitals:  144 palp, hr 80, resp 18, cbg 104, sats 99%

## 2014-05-12 NOTE — ED Provider Notes (Addendum)
CSN: [id number]     Arrival date & time 05/12/14  53 History   First MD Initiated Contact with Patient 05/12/14 1340     Chief Complaint  Patient presents with  . Dizziness     (Consider location/radiation/quality/duration/timing/severity/associated sxs/prior Treatment) HPI Comments: 74 year old male with history of high blood pressure, reflux, lipids, degenerative joint disease, recent right knee replacement on February 1 by Dr.Alusio presents with near syncope, right leg pain and difficulty getting a full breath. Symptoms are gradually worsened today to the point were his vision started to black out close to syncope. No heart history, no active bleeding or melena. No fevers or chills. Patient has had mild redness to the anterior right leg tender to palpation. Patient has swelling to the right knee similar since surgery and today was doing physical therapy. Patient has had decreased by mouth intake but no vomiting or diarrhea. Patient is on Xarelto postop.  Patient is a 74 y.o. male presenting with dizziness. The history is provided by the patient.  Dizziness Associated symptoms: shortness of breath   Associated symptoms: no chest pain, no headaches and no vomiting     Past Medical History  Diagnosis Date  . Hypertension   . Dysrhythmia     when young had issues with heart palpitations altered diet and has not had problems since   . GERD (gastroesophageal reflux disease)     manages with diet   . Arthritis   . Hyperlipidemia   . Tremor of right hand   . Tinnitus   . 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 Dysphasia     for dilation 11/2012 / mild reflux  . Organic erectile dysfunction   . Seasonal allergic rhinitis   . Diverticulosis     hx of   . Degenerative joint disease    Past Surgical History  Procedure Laterality Date  . Right knee sugery      1965 cartilage removed 1983 repaired of damaged ligament   . Tonsillectomy    . Saturation biopsy of prostate     . Colonscopy     . Colon surgery      repair of diverticulum 2005-2006  . 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 No pertinent family history. History  Substance Use Topics  . Smoking status: Never Smoker   . Smokeless tobacco: Never Used  . Alcohol Use: No    Review of Systems  Constitutional: Positive for appetite change and fatigue. Negative for fever and chills.  HENT: Negative for congestion.   Eyes: Negative for visual disturbance.  Respiratory: Positive for shortness of breath.   Cardiovascular: Positive for leg swelling. Negative for chest pain.  Gastrointestinal: Negative for vomiting and abdominal pain.  Genitourinary: Negative for dysuria and flank pain.  Musculoskeletal: Positive for joint swelling. Negative for back pain, neck pain and neck stiffness.  Skin: Positive for rash.  Neurological: Positive for dizziness and light-headedness. Negative for weakness and headaches.      Allergies  Xylocaine  Home Medications   Prior to Admission medications   Medication Sig Start Date End Date Taking? Authorizing Provider  Docusate Sodium (COLACE PO) Take 1 tablet by mouth 2 (two) times daily as needed (constipation).   Yes Historical Provider, MD  losartan (COZAAR) 50 MG tablet Take 50 mg  by mouth every morning.   Yes Historical Provider, MD  methocarbamol (ROBAXIN) 500 MG tablet Take 1 tablet (500 mg total) by mouth every 6 (six) hours as needed for muscle spasms. 05/06/14  Yes Arlee Muslim, PA-C  pravastatin (PRAVACHOL) 40 MG tablet Take 40 mg by mouth daily.   Yes Historical Provider, MD  rivaroxaban (XARELTO) 10 MG TABS tablet Take 1 tablet (10 mg total) by mouth daily with breakfast. Take Xarelto for two and a half more weeks, then discontinue Xarelto. Once the patient has completed the Xarelto, they may resume  the 81 mg Aspirin. 05/06/14  Yes Arlee Muslim, PA-C  traMADol (ULTRAM) 50 MG tablet Take 1-2 tablets (50-100 mg total) by mouth every 6 (six) hours as needed (mild pain). 05/06/14  Yes Arlee Muslim, PA-C  doxycycline (VIBRAMYCIN) 100 MG capsule Take 1 capsule (100 mg total) by mouth 2 (two) times daily. One po bid x 7 days 05/12/14   Mariea Clonts, MD  oxyCODONE (OXY IR/ROXICODONE) 5 MG immediate release tablet Take 1-2 tablets (5-10 mg total) by mouth every 3 (three) hours as needed for moderate pain, severe pain or breakthrough pain. Patient not taking: Reported on 05/12/2014 05/06/14   Arlee Muslim, PA-C   BP 128/64 mmHg  Pulse 76  Temp(Src) 98.3 F (36.8 C) (Oral)  Resp 14  SpO2 98% Physical Exam  Constitutional: He is oriented to person, place, and time. He appears well-developed and well-nourished.  HENT:  Head: Normocephalic and atraumatic.  Dry mucous membranes  Eyes: Conjunctivae are normal. Right eye exhibits no discharge. Left eye exhibits no discharge.  Neck: Normal range of motion. Neck supple. No tracheal deviation present.  Cardiovascular: Normal rate and regular rhythm.   Pulmonary/Chest: Effort normal and breath sounds normal.  Abdominal: Soft. He exhibits no distension. There is no tenderness. There is no guarding.  Musculoskeletal: He exhibits edema and tenderness.  Patient has moderate swelling and tenderness right knee and right anterior shin, no tenderness posterior leg on the right. Neurovascular intact, compartments soft. Mild erythema without induration right anterior tibia.  Neurological: He is alert and oriented to person, place, and time. No cranial nerve deficit.  Skin: Skin is warm. No rash noted.  Psychiatric: He has a normal mood and affect.  Nursing note and vitals reviewed.   ED Course  Procedures (including critical care time) Labs Review Labs Reviewed  BASIC METABOLIC PANEL - Abnormal; Notable for the following:    Sodium 134 (*)    Calcium 8.1 (*)     GFR calc non Af Amer 83 (*)    All other components within normal limits  CBC WITH DIFFERENTIAL/PLATELET - Abnormal; Notable for the following:    RBC 3.36 (*)    Hemoglobin 10.2 (*)    HCT 31.6 (*)    Monocytes Absolute 1.2 (*)    All other components within normal limits  TROPONIN I  I-STAT CG4 LACTIC ACID, ED  I-STAT TROPOININ, ED    Imaging Review Ct Angio Chest Pe W/cm &/or Wo Cm  05/12/2014   CLINICAL DATA:  74 year old male with shortness of breath and presyncope status post knee surgery. Initial encounter.  EXAM: CT ANGIOGRAPHY CHEST WITH CONTRAST  TECHNIQUE: Multidetector CT imaging of the chest was performed using the standard protocol during bolus administration of intravenous contrast. Multiplanar CT image reconstructions and MIPs were obtained to evaluate the vascular anatomy.  CONTRAST:  125mL OMNIPAQUE IOHEXOL 350 MG/ML SOLN  COMPARISON:  CT Abdomen and Pelvis 05/05/2006.  FINDINGS:  Adequate contrast bolus timing in the pulmonary arterial tree. Intermittent motion artifact. No focal filling defect identified in the pulmonary arterial tree to suggest the presence of acute pulmonary embolism.  Mild dependent atelectasis. Major airways are patent. Lung parenchyma otherwise within normal limits.  Negative thoracic inlet. No pericardial or pleural effusion. Calcified atherosclerosis of the aorta and Coronary arteries. No mediastinal or hilar lymphadenopathy. No axillary lymphadenopathy.  Negative visualized liver, spleen, and upper abdominal bowel.  Mild upper thoracic scoliosis. Osteopenia. No acute osseous abnormality identified.  Review of the MIP images confirms the above findings.  IMPRESSION: 1.  No evidence of acute pulmonary embolus. 2. Mild pulmonary atelectasis. 3. Calcified aortic and coronary artery atherosclerosis.   Electronically Signed   By: Genevie Ann M.D.   On: 05/12/2014 15:38     EKG Interpretation   Date/Time:  Tuesday May 12 2014 13:33:10 EST Ventricular Rate:   77 PR Interval:  150 QRS Duration: 86 QT Interval:  397 QTC Calculation: 449 R Axis:   -5 Text Interpretation:  Sinus rhythm Confirmed by Nochum Fenter  MD, Loryn Haacke ([id number])  on 05/13/2014 5:21:36 PM      MDM   Final diagnoses:  Pre-syncope  Dehydration  Cellulitis of right leg  Post-op pain   Patient presents with near syncope, general weakness and difficulty obtaining full breath. Patient has had mild improvement with IV fluids.  Discussed differential diagnosis including dehydration, early joint infection/cellulitis, pulmonary embolism, other. Repeat IV fluid bolus ordered, blood work and CT angiogram chest pending.  CT chest results reviewed no acute bony name wasn't. Patient proves significantly in the ER at IV fluids. Discussed the case with orthopedic specialist who will follow the patient closely outpatient, agrees with oral antibiotics.  Filed Vitals:   05/12/14 1500 05/12/14 1549 05/12/14 1600 05/12/14 1641  BP: 128/64 126/63 128/64   Pulse: 76 75 76   Temp:    98.3 F (36.8 C)  TempSrc:    Oral  Resp: 13 11 14    SpO2: 98% 100% 98%      Mariea Clonts, MD 05/13/14 1722  Mariea Clonts, MD 05/13/14 [phone]

## 2014-05-12 NOTE — Discharge Instructions (Signed)
If you were given medicines take as directed.  If you are on coumadin or contraceptives realize their levels and effectiveness is altered by many different medicines.  If you have any reaction (rash, tongues swelling, other) to the medicines stop taking and see a physician.   Please follow up as directed and return to the ER or see a physician for new or worsening symptoms.  Thank you. Filed Vitals:   05/12/14 1328 05/12/14 1549  BP: 137/67 126/63  Pulse: 72 75  Temp: 97.4 F (36.3 C)   TempSrc: Oral   Resp: 18 11  SpO2: 100% 100%

## 2014-05-12 NOTE — ED Notes (Signed)
Bed: WA21 Expected date:  Expected time:  Means of arrival:  Comments: EMS-orthostatic

## 2014-05-16 ENCOUNTER — Encounter (HOSPITAL_COMMUNITY): Payer: Self-pay | Admitting: Emergency Medicine

## 2014-05-16 ENCOUNTER — Emergency Department (HOSPITAL_COMMUNITY)
Admission: EM | Admit: 2014-05-16 | Discharge: 2014-05-16 | Disposition: A | Discharge status: Home / Self Care | Payer: Medicare Other | Attending: Emergency Medicine | Admitting: Emergency Medicine

## 2014-05-16 DIAGNOSIS — R531 Weakness: Secondary | ICD-10-CM | POA: Insufficient documentation

## 2014-05-16 DIAGNOSIS — Z79899 Other long term (current) drug therapy: Secondary | ICD-10-CM | POA: Diagnosis not present

## 2014-05-16 DIAGNOSIS — Z8709 Personal history of other diseases of the respiratory system: Secondary | ICD-10-CM | POA: Diagnosis not present

## 2014-05-16 DIAGNOSIS — E785 Hyperlipidemia, unspecified: Secondary | ICD-10-CM | POA: Insufficient documentation

## 2014-05-16 DIAGNOSIS — R42 Dizziness and giddiness: Secondary | ICD-10-CM | POA: Diagnosis present

## 2014-05-16 DIAGNOSIS — I10 Essential (primary) hypertension: Secondary | ICD-10-CM | POA: Diagnosis not present

## 2014-05-16 DIAGNOSIS — Z7901 Long term (current) use of anticoagulants: Secondary | ICD-10-CM | POA: Insufficient documentation

## 2014-05-16 DIAGNOSIS — R55 Syncope and collapse: Secondary | ICD-10-CM | POA: Diagnosis not present

## 2014-05-16 DIAGNOSIS — Z8739 Personal history of other diseases of the musculoskeletal system and connective tissue: Secondary | ICD-10-CM | POA: Insufficient documentation

## 2014-05-16 DIAGNOSIS — Z87438 Personal history of other diseases of male genital organs: Secondary | ICD-10-CM | POA: Diagnosis not present

## 2014-05-16 DIAGNOSIS — H9192 Unspecified hearing loss, left ear: Secondary | ICD-10-CM | POA: Diagnosis not present

## 2014-05-16 LAB — URINALYSIS, ROUTINE W REFLEX MICROSCOPIC
Bilirubin Urine: NEGATIVE
GLUCOSE, UA: NEGATIVE mg/dL
HGB URINE DIPSTICK: NEGATIVE
KETONES UR: NEGATIVE mg/dL
LEUKOCYTES UA: NEGATIVE
NITRITE: NEGATIVE
Protein, ur: NEGATIVE mg/dL
SPECIFIC GRAVITY, URINE: 1.005 (ref 1.005–1.030)
UROBILINOGEN UA: 1 mg/dL (ref 0.0–1.0)
pH: 8 (ref 5.0–8.0)

## 2014-05-16 LAB — COMPREHENSIVE METABOLIC PANEL
ALT: 29 U/L (ref 0–53)
AST: 19 U/L (ref 0–37)
Albumin: 3.4 g/dL — ABNORMAL LOW (ref 3.5–5.2)
Alkaline Phosphatase: 57 U/L (ref 39–117)
Anion gap: 10 (ref 5–15)
BILIRUBIN TOTAL: 1.4 mg/dL — AB (ref 0.3–1.2)
BUN: 15 mg/dL (ref 6–23)
CO2: 25 mmol/L (ref 19–32)
CREATININE: 1.07 mg/dL (ref 0.50–1.35)
Calcium: 9.3 mg/dL (ref 8.4–10.5)
Chloride: 102 mmol/L (ref 96–112)
GFR calc Af Amer: 77 mL/min — ABNORMAL LOW (ref >90)
GFR calc non Af Amer: 67 mL/min — ABNORMAL LOW (ref >90)
Glucose, Bld: 109 mg/dL — ABNORMAL HIGH (ref 70–99)
Potassium: 4 mmol/L (ref 3.5–5.1)
Sodium: 137 mmol/L (ref 135–145)
Total Protein: 6.6 g/dL (ref 6.0–8.3)

## 2014-05-16 LAB — CBC WITH DIFFERENTIAL/PLATELET
Basophils Absolute: 0 10*3/uL (ref 0.0–0.1)
Basophils Relative: 0 % (ref 0–1)
EOS ABS: 0.1 10*3/uL (ref 0.0–0.7)
Eosinophils Relative: 1 % (ref 0–5)
HCT: 36.5 % — ABNORMAL LOW (ref 39.0–52.0)
Hemoglobin: 11.8 g/dL — ABNORMAL LOW (ref 13.0–17.0)
LYMPHS ABS: 1.2 10*3/uL (ref 0.7–4.0)
LYMPHS PCT: 14 % (ref 12–46)
MCH: 30 pg (ref 26.0–34.0)
MCHC: 32.3 g/dL (ref 30.0–36.0)
MCV: 92.9 fL (ref 78.0–100.0)
MONO ABS: 1 10*3/uL (ref 0.1–1.0)
MONOS PCT: 11 % (ref 3–12)
NEUTROS ABS: 6.8 10*3/uL (ref 1.7–7.7)
Neutrophils Relative %: 74 % (ref 43–77)
Platelets: 431 10*3/uL — ABNORMAL HIGH (ref 150–400)
RBC: 3.93 MIL/uL — ABNORMAL LOW (ref 4.22–5.81)
RDW: 13.6 % (ref 11.5–15.5)
WBC: 9.1 10*3/uL (ref 4.0–10.5)

## 2014-05-16 LAB — TROPONIN I: Troponin I: <0.03 ng/mL (ref <0.031)

## 2014-05-16 NOTE — Discharge Instructions (Signed)
Near-Syncope Near-syncope (commonly known as near fainting) is sudden weakness, dizziness, or feeling like you might pass out. During an episode of near-syncope, you may also develop pale skin, have tunnel vision, or feel sick to your stomach (nauseous). Near-syncope may occur when getting up after sitting or while standing for a long time. It is caused by a sudden decrease in blood flow to the brain. This decrease can result from various causes or triggers, most of which are not serious. However, because near-syncope can sometimes be a sign of something serious, a medical evaluation is required. The specific cause is often not determined. HOME CARE INSTRUCTIONS  Monitor your condition for any changes. The following actions may help to alleviate any discomfort you are experiencing:  Have someone stay with you until you feel stable.  Lie down right away and prop your feet up if you start feeling like you might faint. Breathe deeply and steadily. Wait until all the symptoms have passed. Most of these episodes last only a few minutes. You may feel tired for several hours.   Drink enough fluids to keep your urine clear or pale yellow.   If you are taking blood pressure or heart medicine, get up slowly when seated or lying down. Take several minutes to sit and then stand. This can reduce dizziness.  Follow up with your health care provider as directed. SEEK IMMEDIATE MEDICAL CARE IF:   You have a severe headache.   You have unusual pain in the chest, abdomen, or back.   You are bleeding from the mouth or rectum, or you have black or tarry stool.   You have an irregular or very fast heartbeat.   You have repeated fainting or have seizure-like jerking during an episode.   You faint when sitting or lying down.   You have confusion.   You have difficulty walking.   You have severe weakness.   You have vision problems.  MAKE SURE YOU:   Understand these instructions.  Will  watch your condition.  Will get help right away if you are not doing well or get worse. Document Released: 03/20/2005 Document Revised: 03/25/2013 Document Reviewed: 08/23/2012 Exit[employer] Patient Information 2015 [employer]. This information is not intended to replace advice given to you by your health care provider. Make sure you discuss any questions you have with your health care provider.  

## 2014-05-16 NOTE — ED Provider Notes (Signed)
CSN: [id number]     Arrival date & time 05/16/14  1235 History   First MD Initiated Contact with Patient 05/16/14 1253     Chief Complaint  Patient presents with  . Dizziness     (Consider location/radiation/quality/duration/timing/severity/associated sxs/prior Treatment) Patient is a 74 y.o. male presenting with dizziness.  Dizziness Quality:  Lightheadedness Severity:  Moderate Onset quality:  Sudden Duration: About 30 minutes. Timing:  Constant Progression:  Resolved Chronicity:  Recurrent (Similar symptoms about a week ago) Context comment:  Shortly after taking pain medication and during physical therapy. Relieved by:  Lying down Worsened by:  Nothing Associated symptoms: weakness (Generalized)   Associated symptoms: no chest pain, no nausea, no shortness of breath and no vomiting     Past Medical History  Diagnosis Date  . Hypertension   . Dysrhythmia     when young had issues with heart palpitations altered diet and has not had problems since   . GERD (gastroesophageal reflux disease)     manages with diet   . Arthritis   . Hyperlipidemia   . Tremor of right hand   . Tinnitus   . 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 Dysphasia     for dilation 11/2012 / mild reflux  . Organic erectile dysfunction   . Seasonal allergic rhinitis   . Diverticulosis     hx of   . Degenerative joint disease    Past Surgical History  Procedure Laterality Date  . Right knee sugery      1965 cartilage removed 1983 repaired of damaged ligament   . Tonsillectomy    . Saturation biopsy of prostate    . Colonscopy     . Colon surgery      repair of diverticulum 2005-2006  . 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 History  Substance Use Topics  . Smoking status: Never Smoker   . Smokeless tobacco: Never  Used  . Alcohol Use: No    Review of Systems  Respiratory: Negative for shortness of breath.   Cardiovascular: Negative for chest pain.  Gastrointestinal: Negative for nausea and vomiting.  Neurological: Positive for dizziness and weakness (Generalized).  All other systems reviewed and are negative.     Allergies  Xylocaine  Home Medications   Prior to Admission medications   Medication Sig Start Date End Date Taking? Authorizing Provider  Docusate Sodium (COLACE PO) Take 1 tablet by mouth 2 (two) times daily as needed (constipation).   Yes Historical Provider, MD  doxycycline (VIBRAMYCIN) 100 MG capsule Take 1 capsule (100 mg total) by mouth 2 (two) times daily. One po bid x 7 days 05/12/14  Yes Mariea Clonts, MD  losartan (COZAAR) 50 MG tablet Take 50 mg by mouth every morning.   Yes Historical Provider, MD  methocarbamol (ROBAXIN) 500 MG tablet Take 1 tablet (500 mg total) by mouth every 6 (six) hours as needed for muscle spasms. 05/06/14  Yes Arlee Muslim, PA-C  oxyCODONE (OXY IR/ROXICODONE) 5 MG immediate release tablet Take 1-2 tablets (5-10 mg total) by mouth every 3 (three) hours as needed for moderate pain, severe pain or breakthrough pain. 05/06/14  Yes Arlee Muslim, PA-C  polyethylene glycol (MIRALAX / GLYCOLAX) packet Take 17 g by mouth daily.   Yes Historical  Provider, MD  pravastatin (PRAVACHOL) 40 MG tablet Take 40 mg by mouth daily.   Yes Historical Provider, MD  rivaroxaban (XARELTO) 10 MG TABS tablet Take 1 tablet (10 mg total) by mouth daily with breakfast. Take Xarelto for two and a half more weeks, then discontinue Xarelto. Once the patient has completed the Xarelto, they may resume the 81 mg Aspirin. 05/06/14  Yes Arlee Muslim, PA-C  traMADol (ULTRAM) 50 MG tablet Take 1-2 tablets (50-100 mg total) by mouth every 6 (six) hours as needed (mild pain). 05/06/14  Yes Arlee Muslim, PA-C   BP 122/64 mmHg  Pulse 80  Temp(Src) 98.3 F (36.8 C) (Oral)  Resp 18  SpO2  95% Physical Exam  Constitutional: He is oriented to person, place, and time. He appears well-developed and well-nourished. No distress.  HENT:  Head: Normocephalic and atraumatic.  Mouth/Throat: Oropharynx is clear and moist.  Eyes: Conjunctivae are normal. Pupils are equal, round, and reactive to light. No scleral icterus.  Neck: Neck supple.  Cardiovascular: Normal rate, regular rhythm, normal heart sounds and intact distal pulses.   No murmur heard. Pulmonary/Chest: Effort normal and breath sounds normal. No stridor. No respiratory distress. He has no wheezes. He has no rales.  Abdominal: Soft. He exhibits no distension. There is no tenderness.  Musculoskeletal: Normal range of motion. He exhibits no edema.  Right knee: Incision appears clean dry and intact, moderate swelling and moderate warmth, no drainage, neurovascularly intact distally.  Right lower leg: No significant erythema or tenderness.  Neurological: He is alert and oriented to person, place, and time.  Skin: Skin is warm and dry. No rash noted.  Psychiatric: He has a normal mood and affect. His behavior is normal.  Nursing note and vitals reviewed.   ED Course  Procedures (including critical care time) Labs Review Labs Reviewed  CBC WITH DIFFERENTIAL/PLATELET - Abnormal; Notable for the following:    RBC 3.93 (*)    Hemoglobin 11.8 (*)    HCT 36.5 (*)    Platelets 431 (*)    All other components within normal limits  COMPREHENSIVE METABOLIC PANEL - Abnormal; Notable for the following:    Glucose, Bld 109 (*)    Albumin 3.4 (*)    Total Bilirubin 1.4 (*)    GFR calc non Af Amer 67 (*)    GFR calc Af Amer 77 (*)    All other components within normal limits  URINALYSIS, ROUTINE W REFLEX MICROSCOPIC - Abnormal; Notable for the following:    APPearance CLOUDY (*)    All other components within normal limits  TROPONIN I    Imaging Review No results found.   EKG Interpretation   Date/Time:  Saturday  May 16 2014 13:44:30 EST Ventricular Rate:  76 PR Interval:  155 QRS Duration: 88 QT Interval:  405 QTC Calculation: 455 R Axis:   -8 Text Interpretation:  Sinus rhythm No significant change was found  Confirmed by [facility]  MD, TREY ([id number]) on 05/16/2014 2:04:02 PM      MDM   Final diagnoses:  Near syncope    74 year old male who is recently status post right knee replacement. He had an episode of dizziness and near syncope today. He had a similar episode a week ago, at which time he was evaluated in the emergency department. He was given IV fluids and treated for cellulitis with doxycycline. A CTA PE study was performed at that time and was negative.  Today, he took his medications, including his tramadol  for pain. Then he began his rehabilitation exercises.  He then became dizzy and felt like he was in a pass out. Therefore, he laid down. Eventually his symptoms improved. I suspect that his near syncope was related to her tramadol and a vasovagal reaction to the pain of his exercises.  I don't suspect PE, especially with a negative CT scan with similar symptoms a week ago and compliance with his Xarelto. His symptoms are unlikely to represent cardiogenic near syncope, given his history and exam and EKG. I think he is stable for outpatient treatment and will advise outpatient follow-up.    Houston Siren III, MD 05/16/14 [phone]

## 2014-05-16 NOTE — ED Notes (Addendum)
Per EMS: pt states he was doing knee exercises then felt as if he could catch his breathe. Pt then stood up and walked and felt dizzy and lightheaded. No LOC, n/v, denies pain. Pt has right knee replacement on Feb 1

## 2014-05-16 NOTE — ED Notes (Signed)
Pt ambulated w/o assistance, pt had no difficulty or dizziness.

## 2014-05-16 NOTE — ED Notes (Signed)
Bed: WA04 Expected date: 05/16/14 Expected time: 12:28 PM Means of arrival: Ambulance Comments: Near Syncope ? Anxiety related

## 2015-06-08 DIAGNOSIS — K219 Gastro-esophageal reflux disease without esophagitis: Secondary | ICD-10-CM | POA: Insufficient documentation

## 2015-06-08 DIAGNOSIS — R1314 Dysphagia, pharyngoesophageal phase: Secondary | ICD-10-CM | POA: Insufficient documentation

## 2015-12-09 IMAGING — CT CT ANGIO CHEST
1 of 2 series · 19 of 32 positions shown · IV contrast (OMNIPAQUE 350)
Comparison: CT Abdomen and Pelvis 05/05/2006.

CLINICAL DATA: 73-year-old male with shortness of breath and
presyncope status post knee surgery. Initial encounter.

EXAM:
CT ANGIOGRAPHY CHEST WITH CONTRAST
TECHNIQUE: Multidetector CT imaging of the chest was performed using the
standard protocol during bolus administration of intravenous
contrast. Multiplanar CT image reconstructions and MIPs were
obtained to evaluate the vascular anatomy.
CONTRAST:  100mL OMNIPAQUE IOHEXOL 350 MG/ML SOLN

[Series 6: thins for pacs · axial · 0.74mm/px · z∈[+1358,+1618]mm · 19 of 291 slices shown]
[im 15/291  lung]
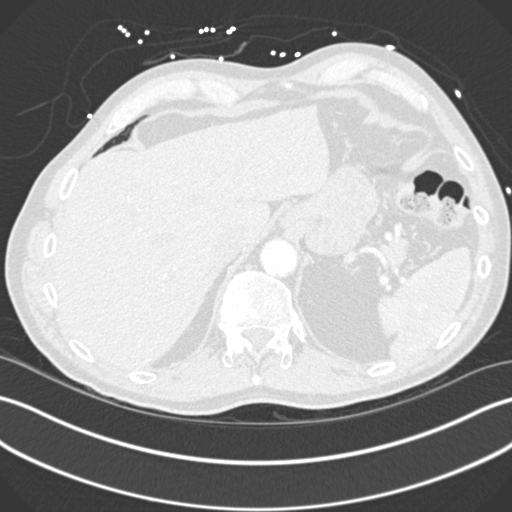
[im 30/291  mediastinal]
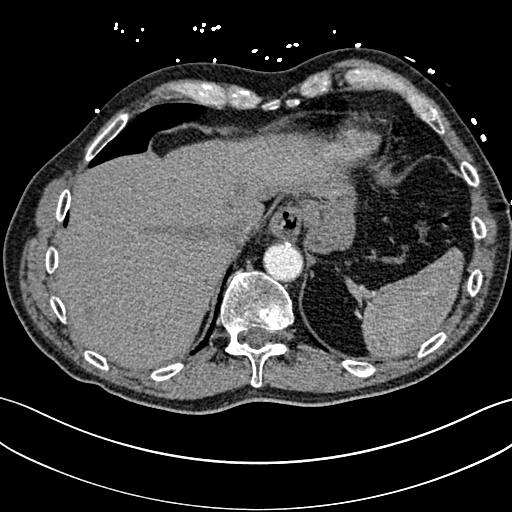
[im 44/291  lung]
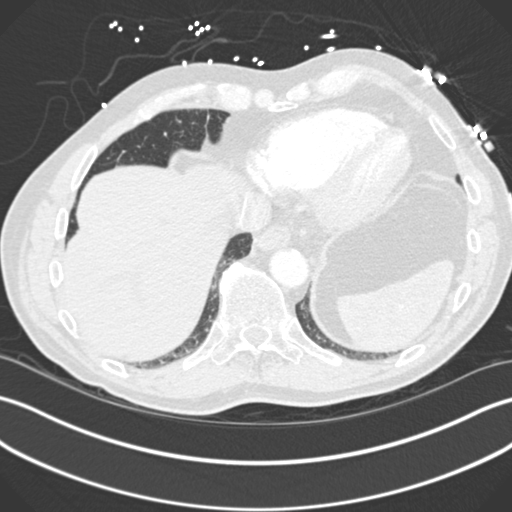
[im 73/291  mediastinal]
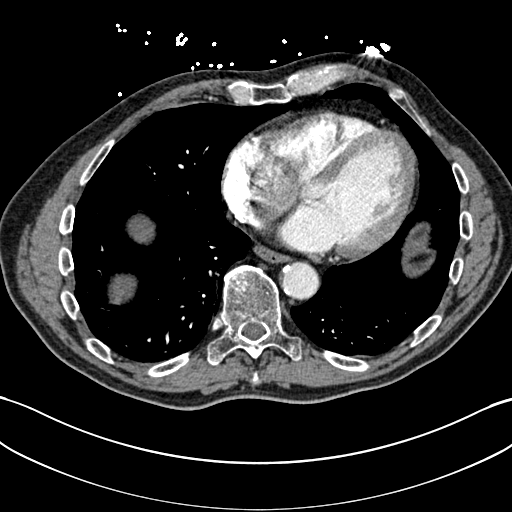
[im 88/291  lung]
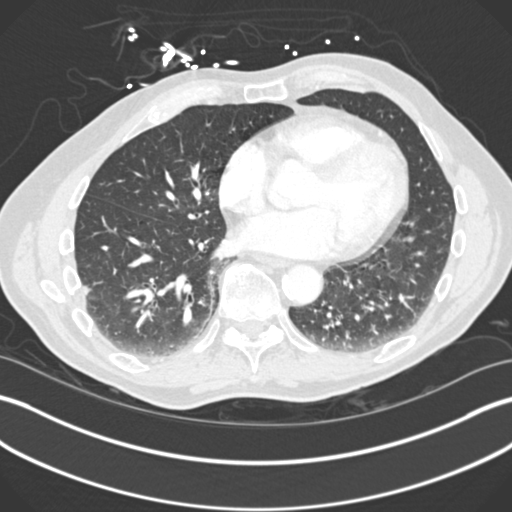
[im 97/291  mediastinal]
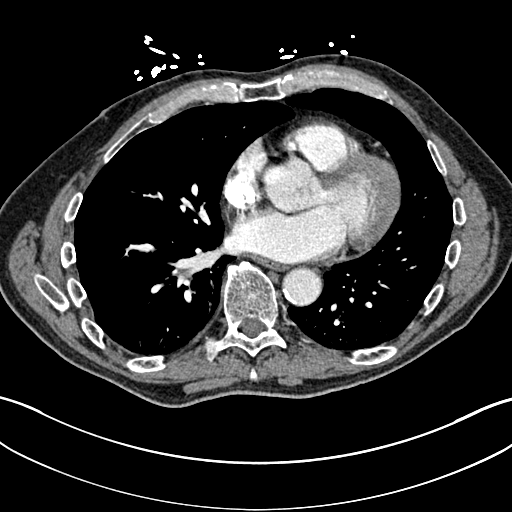
[im 102/291  lung]
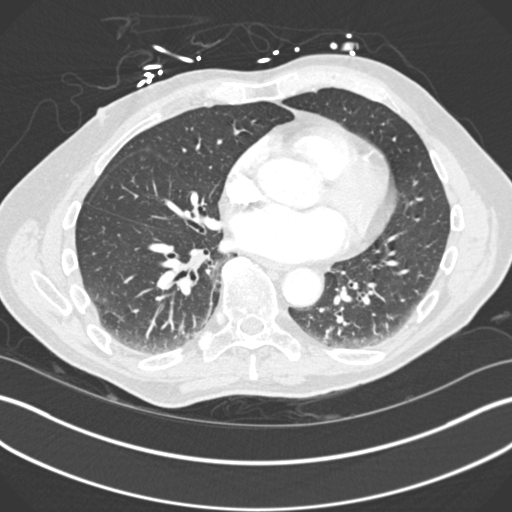
[im 117/291  mediastinal]
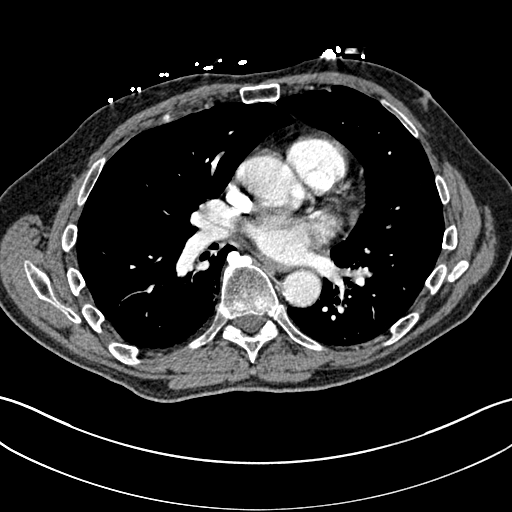
[im 131/291  lung]
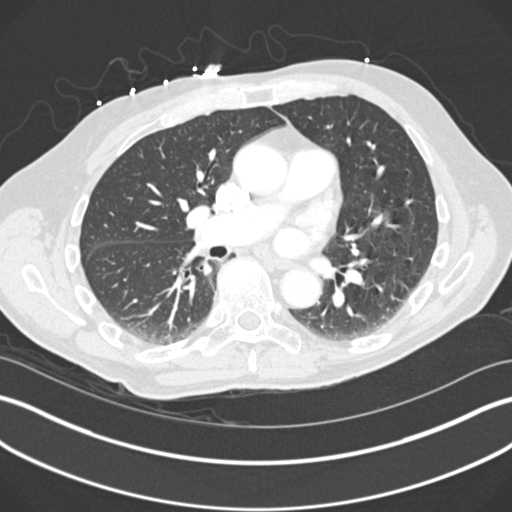
[im 146/291  mediastinal]
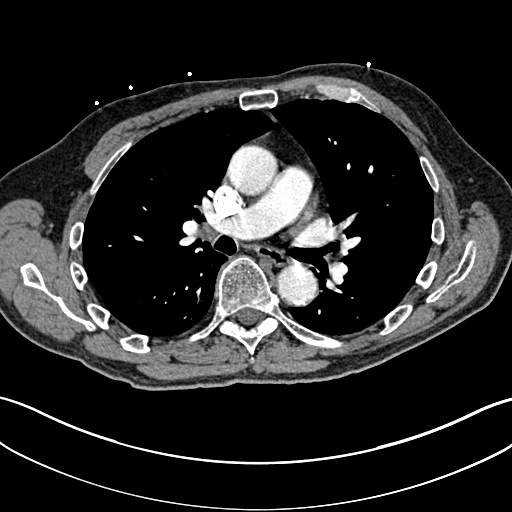
[im 160/291  lung]
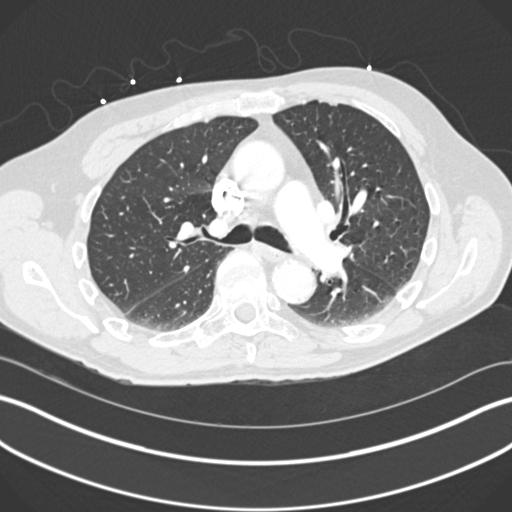
[im 175/291  mediastinal]
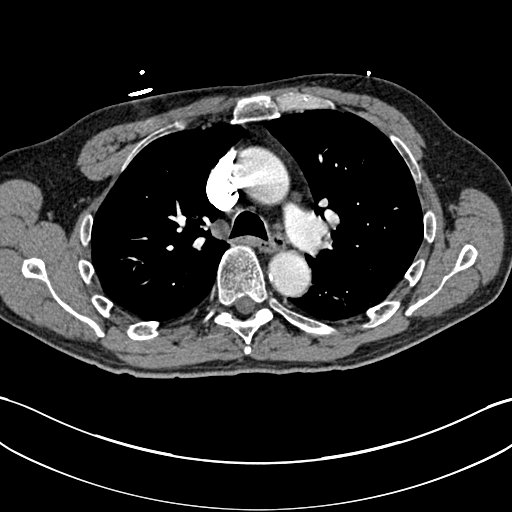
[im 189/291  lung]
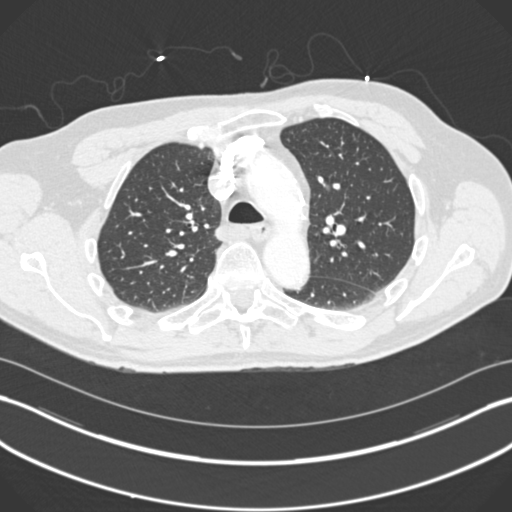
[im 194/291  mediastinal]
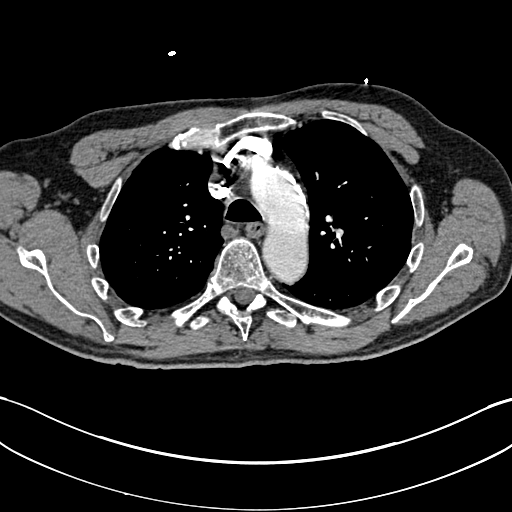
[im 204/291  lung]
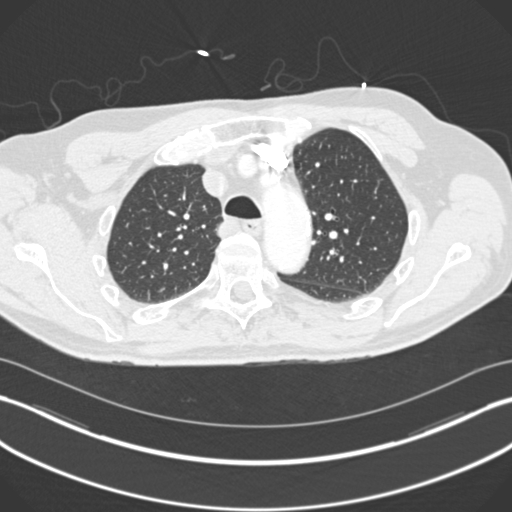
[im 218/291  mediastinal]
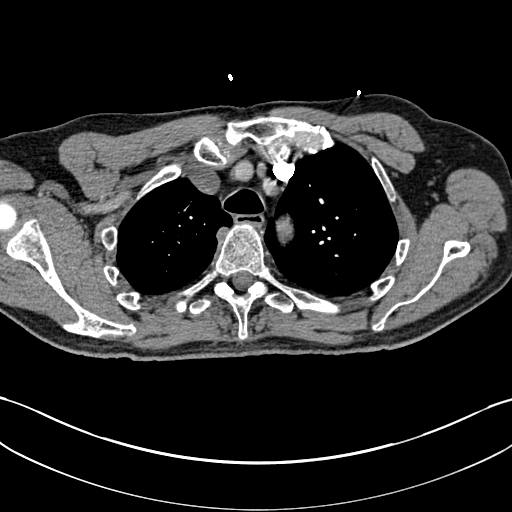
[im 247/291  lung]
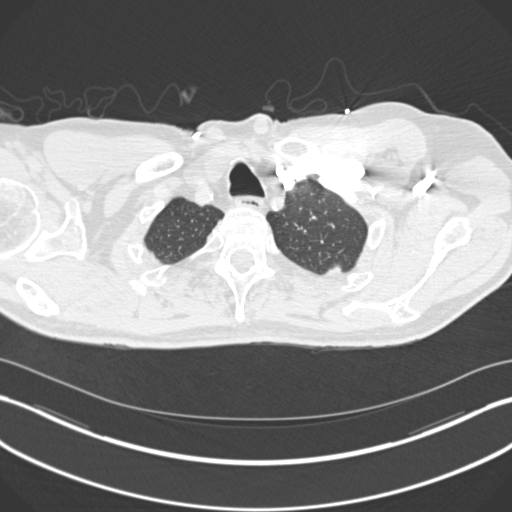
[im 262/291  mediastinal]
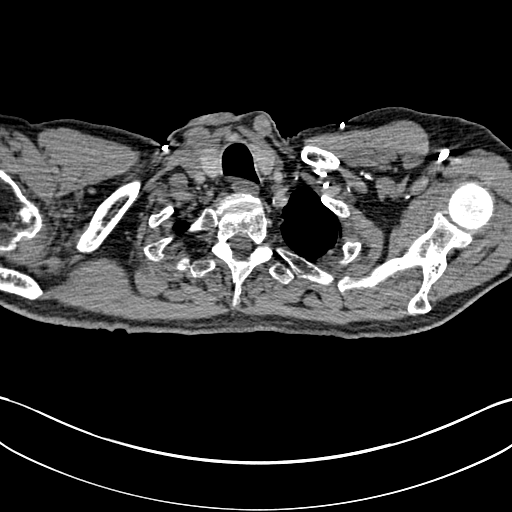
[im 276/291  lung]
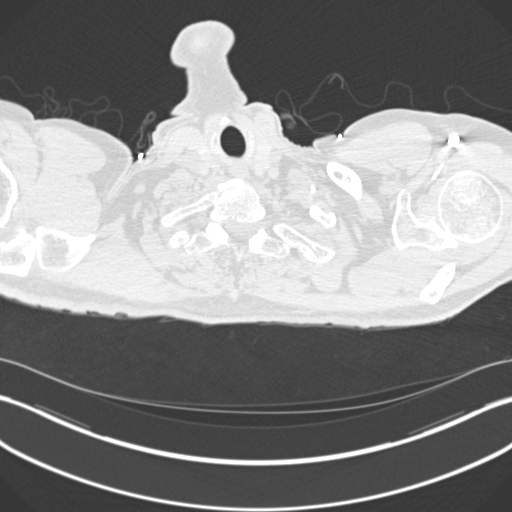

[19 of 32 positions shown; findings below may reference images not displayed]

FINDINGS: Adequate contrast bolus timing in the pulmonary arterial tree.
Intermittent motion artifact. No focal filling defect identified in
the pulmonary arterial tree to suggest the presence of acute
pulmonary embolism.

Mild dependent atelectasis. Major airways are patent. Lung
parenchyma otherwise within normal limits.

Negative thoracic inlet. No pericardial or pleural effusion.
Calcified atherosclerosis of the aorta and Coronary arteries. No
mediastinal or hilar lymphadenopathy. No axillary lymphadenopathy.

Negative visualized liver, spleen, and upper abdominal bowel.

Mild upper thoracic scoliosis. Osteopenia. No acute osseous
abnormality identified.

Review of the MIP images confirms the above findings.
IMPRESSION: 1.  No evidence of acute pulmonary embolus.
2. Mild pulmonary atelectasis.
3. Calcified aortic and coronary artery atherosclerosis.

## 2017-01-08 ENCOUNTER — Telehealth: Payer: Self-pay | Admitting: Internal Medicine

## 2017-01-08 NOTE — Telephone Encounter (Signed)
Received records from [facility] for appointment on 01/10/17 with Dr Hilty.  Records put with Dr Hilty's schedule for 01/10/17. lp °

## 2017-01-10 ENCOUNTER — Ambulatory Visit (INDEPENDENT_AMBULATORY_CARE_PROVIDER_SITE_OTHER): Payer: Medicare HMO | Admitting: Internal Medicine

## 2017-01-10 ENCOUNTER — Encounter: Payer: Self-pay | Admitting: Internal Medicine

## 2017-01-10 ENCOUNTER — Encounter (INDEPENDENT_AMBULATORY_CARE_PROVIDER_SITE_OTHER): Payer: Self-pay

## 2017-01-10 VITALS — BP 128/78 | HR 56 | Ht 76.0 in | Wt 203.0 lb

## 2017-01-10 DIAGNOSIS — R42 Dizziness and giddiness: Secondary | ICD-10-CM | POA: Insufficient documentation

## 2017-01-10 DIAGNOSIS — R001 Bradycardia, unspecified: Secondary | ICD-10-CM

## 2017-01-10 DIAGNOSIS — R55 Syncope and collapse: Secondary | ICD-10-CM

## 2017-01-10 NOTE — Progress Notes (Signed)
OFFICE CONSULT NOTE  Chief Complaint:  Dizziness/presyncope  Primary Care Physician: Dominic Solian, MD  HPI:  Dominic Young is a 76 y.o. male who is being seen today for the evaluation of dizziness/presyncope at the request of Avva, Ravisankar, MD. 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 He had seen a cardiologist and underwent testing and was felt to have benign palpitations. He said he decrease caffeine in his diet and this palpitations improved. Recently was playing golf on a hot day outside and he felt somewhat dizzy. He said he bent over to pick up a ball and felt like he might pass out. He was seen by his primary care provider and found to have decreased heart rate in the 50s. In 2016 his EKG demonstrated heart rates in the 70s. Of note he is on propranolol 20 mg twice a day which was started for essential tremor. He reports his father had tremor is well. He's not sure that the propranolol actually is helped his tremor much. Eyes any chest pain or worsening shortness of breath. He's had no recent fatigue or change in exercise tolerance. EKG today was personally reviewed demonstrating sinus bradycardia 56 without ischemic changes. Blood pressure is normal. Of note, he had a CT angiogram in February 2016 was found to have calcified aortic and coronary artery atherosclerosis. He is on statin therapy.  PMHx:  Past Medical History:  Diagnosis Date  . Arthritis   . Degenerative joint disease   . Diverticulosis    hx of   . Dysphasia    for dilation 11/2012 / mild reflux  . Dysrhythmia    when young had issues with heart palpitations altered diet and has not had problems since   . GERD (gastroesophageal reflux disease)    manages with diet   . 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 Hyperlipidemia   . Hypertension   . Organic erectile dysfunction   . Seasonal allergic rhinitis   . Tinnitus   .  Tremor of right hand     Past Surgical History:  Procedure Laterality Date  . COLON SURGERY     repair of diverticulum 2005-2006  . colonscopy     . right knee sugery     1965 cartilage removed 1983 repaired of damaged ligament   . SATURATION BIOPSY OF PROSTATE    . TONSILLECTOMY    . 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   Procedure: RIGHT TOTAL KNEE ARTHROPLASTY;  Surgeon: [name], MD;  Location: [facility];  Service: Orthopedics;  Laterality: Right;    FAMHx:  History reviewed. No pertinent family history. Cancer is in the family but no history of coronary artery disease in first-degree relatives.  SOCHx:   reports that he has never smoked. He has never used smokeless tobacco. He reports that he does not drink alcohol or use drugs.  ALLERGIES:  Allergies  Allergen Reactions  . Xylocaine [Lidocaine]     Heart races    ROS: Pertinent items noted in HPI and remainder of comprehensive ROS otherwise negative.  HOME MEDS: Current Outpatient Prescriptions on File Prior to Visit  Medication Sig Dispense Refill  . losartan (COZAAR) 50 MG tablet Take 50 mg by mouth every morning.    Marland Kitchen oxyCODONE (OXY IR/ROXICODONE) 5 MG immediate release tablet Take 1-2 tablets (5-10 mg total) by mouth every 3 (three) hours as  needed for moderate pain, severe pain or breakthrough pain. 90 tablet 0  . pravastatin (PRAVACHOL) 40 MG tablet Take 40 mg by mouth daily.     No current facility-administered medications on file prior to visit.     LABS/IMAGING: No results found for this or any previous visit (from the past 48 hour(s)). No results found.  LIPID PANEL: No results found for: CHOL, TRIG, HDL, CHOLHDL, VLDL, LDLCALC, LDLDIRECT  WEIGHTS: Wt Readings from Last 3 Encounters:  01/10/17 203 lb (92.1 kg)  05/04/14 210 lb (95.3 kg)  05/01/14 210 lb (95.3 kg)    VITALS: BP 128/78   Pulse (!) 56   Ht 6\' 4"  (1.93 m)   Wt 203 lb (92.1 kg)   BMI 24.71 kg/m   EXAM: General  appearance: alert and no distress Neck: no carotid bruit, no JVD and thyroid not enlarged, symmetric, no tenderness/mass/nodules Lungs: clear to auscultation bilaterally Heart: regular rate and rhythm, S1, S2 normal, no murmur, click, rub or gallop Abdomen: soft, non-tender; bowel sounds normal; no masses,  no organomegaly Extremities: extremities normal, atraumatic, no cyanosis or edema and varicose veins noted Pulses: 2+ and symmetric Skin: Skin color, texture, turgor normal. No rashes or lesions Neurologic: Grossly normal Psych: Pleasant  EKG: Sinus bradycardia 56 - personally reviewed  ASSESSMENT: 1. Dizziness/presyncope 2. History of PVCs 3. Sinus bradycardia 4. Asymptomatic coronary artery disease 5. Dyslipidemia  PLAN: 1.   [name] had an episode of dizziness and presyncope. This may been attributed to orthostasis or perhaps worsening bradycardia. He is noted to be bradycardic however appropriate given the fact that he is on propranolol. It is possible he could've had progressive conduction system disease and which case the propranolol may be magnifying that. I recommend discontinuing propranolol (especially since he reports it has not helped his tremor) and will monitor his symptoms. He may wish to see a neurologist or consider other options for treatment of essential tremor such as primidone. He's had no further episodes of dizziness or palpitations. Plan to see him back in 4-6 weeks. If he continues to have problems, will recommend monitoring and possibly an echocardiogram at that time.  Thanks for the kind referral.  Pixie Casino, MD, [place]  Attending Cardiologist  Direct Dial: [phone]  Fax: [phone]  Website:  [email or web address] 01/10/2017, 8:41 AM

## 2017-01-10 NOTE — Patient Instructions (Addendum)
Your physician has recommended you make the following change in your medication:  -- STOP propranolol   Your physician recommends that you schedule a follow-up appointment in: 4-6 weeks with Dr. Debara Pickett.

## 2017-02-19 ENCOUNTER — Ambulatory Visit: Payer: Medicare HMO | Admitting: Internal Medicine

## 2017-02-19 ENCOUNTER — Encounter: Payer: Self-pay | Admitting: Internal Medicine

## 2017-02-19 VITALS — BP 119/74 | HR 63 | Ht 76.0 in | Wt 208.6 lb

## 2017-02-19 DIAGNOSIS — R251 Tremor, unspecified: Secondary | ICD-10-CM | POA: Diagnosis not present

## 2017-02-19 DIAGNOSIS — R42 Dizziness and giddiness: Secondary | ICD-10-CM | POA: Diagnosis not present

## 2017-02-19 DIAGNOSIS — R001 Bradycardia, unspecified: Secondary | ICD-10-CM

## 2017-02-19 DIAGNOSIS — R55 Syncope and collapse: Secondary | ICD-10-CM

## 2017-02-19 NOTE — Progress Notes (Signed)
OFFICE CONSULT NOTE  Chief Complaint:  Follow-up dizziness, presyncope  Primary Care Physician: Dominic Solian, MD  HPI:  Dominic Young is a 76 y.o. male who is being seen today for the evaluation of dizziness/presyncope at the request of Avva, Ravisankar, MD. 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 He had seen a cardiologist and underwent testing and was felt to have benign palpitations. He said he decrease caffeine in his diet and this palpitations improved. Recently was playing golf on a hot day outside and he felt somewhat dizzy. He said he bent over to pick up a ball and felt like he might pass out. He was seen by his primary care provider and found to have decreased heart rate in the 50s. In 2016 his EKG demonstrated heart rates in the 70s. Of note he is on propranolol 20 mg twice a day which was started for essential tremor. He reports his father had tremor is well. He's not sure that the propranolol actually is helped his tremor much. Eyes any chest pain or worsening shortness of breath. He's had no recent fatigue or change in exercise tolerance. EKG today was personally reviewed demonstrating sinus bradycardia 56 without ischemic changes. Blood pressure is normal. Of note, he had a CT angiogram in February 2016 was found to have calcified aortic and coronary artery atherosclerosis. He is on statin therapy.  02/19/2017  Dominic Young returns for follow-up of dizziness, presyncope. He was noted to be bradycardic at our last visit.  He had been taking propranolol for tremor.  I advised discontinuing this and noted that heart rate has improved as well as blood pressure.  He reports his dizziness has improved.  He is also tried to increase his hydration.  His tremor however is somewhat worse, as expected.  I did discuss that there are other medication options rather than a beta-blocker that could be used for his tremor.  I  advised that he should discuss this with his primary care provider and/or a neurologist.  PMHx:  Past Medical History:  Diagnosis Date  . Arthritis   . Degenerative joint disease   . Diverticulosis    hx of   . Dysphasia    for dilation 11/2012 / mild reflux  . Dysrhythmia    when young had issues with heart palpitations altered diet and has not had problems since   . GERD (gastroesophageal reflux disease)    manages with diet   . 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 Hyperlipidemia   . Hypertension   . Organic erectile dysfunction   . Seasonal allergic rhinitis   . Tinnitus   . Tremor of right hand     Past Surgical History:  Procedure Laterality Date  . COLON SURGERY     repair of diverticulum 2005-2006  . colonscopy     . right knee sugery     1965 cartilage removed 1983 repaired of damaged ligament   . RIGHT 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   Performed by Dominic Alf, MD at [facility]  . SATURATION BIOPSY OF PROSTATE    . TONSILLECTOMY      FAMHx:  Family History  Problem Relation Age of Onset  . Valvular heart disease Mother        valve replaced  . Cancer Mother   . Cancer Brother   . Other Son  bronchiectasis    Cancer is in the family but no history of coronary artery disease in first-degree relatives.  SOCHx:   reports that  has never smoked. he has never used smokeless tobacco. He reports that he does not drink alcohol or use drugs.  ALLERGIES:  Allergies  Allergen Reactions  . Xylocaine [Lidocaine]     Heart races    ROS: Pertinent items noted in HPI and remainder of comprehensive ROS otherwise negative.  HOME MEDS: Current Outpatient Medications on File Prior to Visit  Medication Sig Dispense Refill  . aspirin 81 MG tablet Take 81 mg by mouth daily.    Marland Kitchen losartan (COZAAR) 50 MG tablet Take 50 mg by mouth every morning.    . Pantoprazole Sodium (PROTONIX PO) Take 1 tablet by mouth daily.    . pravastatin (PRAVACHOL)  40 MG tablet Take 40 mg by mouth daily.    Marland Kitchen oxyCODONE (OXY IR/ROXICODONE) 5 MG immediate release tablet Take 1-2 tablets (5-10 mg total) by mouth every 3 (three) hours as needed for moderate pain, severe pain or breakthrough pain. 90 tablet 0   No current facility-administered medications on file prior to visit.     LABS/IMAGING: No results found for this or any previous visit (from the past 48 hour(s)). No results found.  LIPID PANEL: No results found for: CHOL, TRIG, HDL, CHOLHDL, VLDL, LDLCALC, LDLDIRECT  WEIGHTS: Wt Readings from Last 3 Encounters:  02/19/17 208 lb 9.6 oz (94.6 kg)  01/10/17 203 lb (92.1 kg)  05/04/14 210 lb (95.3 kg)    VITALS: BP 119/74   Pulse 63   Ht 6\' 4"  (1.93 m)   Wt 208 lb 9.6 oz (94.6 kg)   SpO2 94%   BMI 25.39 kg/m   EXAM: Deferred  EKG: Deferred  ASSESSMENT: 1. Dizziness/presyncope 2. History of PVCs 3. Sinus bradycardia 4. Asymptomatic coronary artery disease 5. Dyslipidemia  PLAN: 1.   [name] feels well without any more dizziness or presyncope.  Heart rate is now higher up in the 60s.  Blood pressure is normal.  I advised continuing to stay off of any beta-blockers.  He will need to discuss his tremor with his PCP in January and may be a candidate for an alternative medication such as primidone.  Follow-up with me as needed.  Dominic Casino, MD, [facility], [place] Director of the [facility]id [facility]  [facility] Attending Cardiologist  Direct Dial: [phone]  Fax: [phone]  Website:  [email or web address] 02/19/2017, 10:18 AM

## 2017-02-19 NOTE — Patient Instructions (Signed)
Your physician recommends that you schedule a follow-up appointment as needed  

## 2018-02-13 DIAGNOSIS — M25552 Pain in left hip: Secondary | ICD-10-CM | POA: Insufficient documentation

## 2018-11-01 ENCOUNTER — Ambulatory Visit
Admission: RE | Admit: 2018-11-01 | Discharge: 2018-11-01 | Disposition: A | Discharge status: Home / Self Care | Payer: Medicare HMO | Source: Ambulatory Visit | Attending: Radiation Oncology | Admitting: Radiation Oncology

## 2018-11-01 ENCOUNTER — Encounter: Payer: Self-pay | Admitting: Radiation Oncology

## 2018-11-01 ENCOUNTER — Other Ambulatory Visit: Payer: Self-pay

## 2018-11-01 ENCOUNTER — Encounter: Payer: Self-pay | Admitting: *Deleted

## 2018-11-01 DIAGNOSIS — C61 Malignant neoplasm of prostate: Secondary | ICD-10-CM

## 2018-11-01 NOTE — Progress Notes (Signed)
Radiation Oncology         [phone] ________________________________  Initial outpatient Consultation - Conducted via telephone due to current COVID-19 concerns for limiting patient exposure  Name: Dominic Young MRN: [id number]  Date: 11/01/2018  DOB: 11-06-1940  AC:ZYSA, Steva Ready, MD  Prince Solian, MD   REFERRING PHYSICIAN: Prince Solian, MD  DIAGNOSIS: 78 y.o. gentleman with Stage T1c adenocarcinoma of the prostate with Gleason score of 3+3, and PSA of 10.15.    ICD-10-CM   1. Malignant neoplasm of prostate ([place])  C61     HISTORY OF PRESENT ILLNESS: Dominic Young is a 78 y.o. male with a diagnosis of prostate cancer. He has a longstanding history of elevated PSA dating back to at least 2001 and has been followed by Dr. Karsten Ro. The PSA was up to 5.5 in 09/1999 and biopsy was performed at the time, which showed BPH and inflammation only, no malignancy. He was treated successfully with antibiotics then and again in 2011 with appropriate PSA response each time. His PSA stabilized around the 5-6 range for several years and no bothersome LUTS.  He has continued with close monitoring of the PSA with his PCP and more recently was noted to have an elevated PSA of 9.0 in 04/2018 by his primary care physician, Dr. Dagmar Hait and was therefore referred back to Urology for further evaluation with Dr. Karsten Ro.  The PSA was repeated on 08/14/18 and further elevated at 10.15, digital rectal examination was performed at that time and was benign.  The patient proceeded to transrectal ultrasound with 12 biopsies of the prostate on 09/10/2018.  The prostate volume measured 118 cc.  Out of 12 core biopsies, 2 were positive.  The maximum Gleason score was 3+3, and this was seen in left apex and right apex.  The patient reviewed the biopsy results with his urologist and he has kindly been referred today for discussion of potential radiation treatment options.  He has had lengthy discussions with Dr. Karsten Ro  and Dr. Alinda Money regarding treatment options and both support the recommendation of active surveillance given his advanced age and low risk disease but the patient remains interested in learning more about radiation treatment options before reaching a final decision.   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    Hyperlipidemia    Hypertension    Organic erectile dysfunction    Seasonal allergic rhinitis    Tinnitus    Tremor of right hand       PAST SURGICAL HISTORY: Past Surgical History:  Procedure Laterality Date   COLON SURGERY     repair of diverticulum 2005-2006   colonscopy      right knee sugery     1965 cartilage removed 1983 repaired of damaged ligament    SATURATION BIOPSY OF PROSTATE     TONSILLECTOMY     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   Procedure: RIGHT TOTAL KNEE ARTHROPLASTY;  Surgeon: [name], MD;  Location: [facility];  Service: Orthopedics;  Laterality: Right;    FAMILY HISTORY:  Family History  Problem Relation Age of Onset   Valvular heart disease Mother        valve replaced   Lung cancer Mother    Pneumonia Father    Cancer Brother    Other Son        bronchiectasis    Breast cancer Maternal Aunt    Prostate cancer Maternal Uncle     SOCIAL HISTORY:  Social History   Socioeconomic History   Marital status: Married    Spouse name: Not on file   Number of children: 3   Years of education: 14   Highest education level: Not on file  Occupational History   Occupation: real estate   Occupation: retired from  [employer]  Social Needs   Emergency planning/management officer strain: Not on file   Food insecurity    Worry: Not on file    Inability: Not on Lexicographer needs    Medical: Not on file    Non-medical: Not on file  Tobacco Use   Smoking status: Never Smoker   Smokeless tobacco: Never Used  Substance and Sexual Activity   Alcohol use: No   Drug use: No   Sexual activity: Not Currently  Lifestyle   Physical activity    Days per week: Not on file    Minutes per session: Not on file   Stress: Not on file  Relationships   Social connections    Talks on phone: Not on file    Gets together: Not on file    Attends religious service: Not on file    Active member of club or organization: Not on file    Attends meetings of clubs or organizations: Not on file    Relationship status: Not on file   Intimate partner violence    Fear of current or ex partner: Not on file    Emotionally abused: Not on file    Physically abused: Not on file    Forced sexual activity: Not on file  Other Topics Concern   Not on file  Social History Narrative   Exercises 3 days/week - treadmill, strength/resistance training, golf    ALLERGIES: Xylocaine [lidocaine]  MEDICATIONS:  Current Outpatient Medications  Medication Sig Dispense Refill   aspirin 81 MG tablet Take 81 mg by mouth daily.     losartan (COZAAR) 50 MG tablet Take 50 mg by mouth every morning.     Pantoprazole Sodium (PROTONIX PO) Take 1 tablet by mouth daily.     pravastatin (PRAVACHOL) 40 MG tablet Take 40 mg by mouth daily.     No current facility-administered medications for this encounter.     REVIEW OF SYSTEMS:  On review of systems, the patient reports that he is doing well overall. He denies any chest pain, shortness of breath, cough, fevers, chills, night sweats, unintended weight changes. He denies any bowel disturbances, and denies abdominal pain, nausea or vomiting. He denies any new musculoskeletal  or joint aches or pains. His IPSS was 4, indicating mild to no urinary symptoms despite his significantly enlarged prostate. His SHIM was 1, indicating he has severe erectile dysfunction. A complete review of systems is obtained and is otherwise negative.    PHYSICAL EXAM:  Wt Readings from Last 3 Encounters:  11/01/18 202 lb (91.6 kg)  02/19/17 208 lb 9.6 oz (94.6 kg)  01/10/17 203 lb (92.1 kg)   Temp Readings from Last 3 Encounters:  05/16/14 98.2 F (36.8 C) (Oral)  05/12/14 98.3 F (36.8 C) (Oral)  05/06/14 98.5 F (36.9 C) (Oral)   BP Readings from Last 3 Encounters:  02/19/17 119/74  01/10/17 128/78  05/16/14 124/83   Pulse Readings from Last 3 Encounters:  02/19/17 63  01/10/17 (!) 56  05/16/14 71   Pain Assessment Pain Score: 0-No pain/10  Unable to assess due to telephone consult visit format.   KPS = 100  100 - Normal; no complaints; no evidence of disease. 90   - Able to carry on normal activity; minor signs or symptoms of disease. 80   - Normal activity with effort; some signs or symptoms of disease. 80   - Cares for self; unable to carry on normal activity or to do active work. 60   - Requires occasional assistance, but is able to care for most of his personal needs. 50   - Requires considerable assistance and frequent medical care. 36   - Disabled; requires special care and assistance. 15   - Severely disabled; hospital admission is indicated although death not imminent. 62   - Very sick; hospital admission necessary; active supportive treatment necessary. 10   - Moribund; fatal processes progressing rapidly. 0     - Dead  Karnofsky DA, Abelmann [place], Craver LS and Burchenal [facility] [phone]) The use of the nitrogen mustards in the palliative treatment of carcinoma: with particular reference to bronchogenic carcinoma Cancer 1 634-56  LABORATORY DATA:  Lab Results  Component Value Date   WBC 9.1 05/16/2014   HGB 11.8 (L) 05/16/2014   HCT 36.5 (L) 05/16/2014    MCV 92.9 05/16/2014   PLT 431 (H) 05/16/2014   Lab Results  Component Value Date   NA 137 05/16/2014   K 4.0 05/16/2014   CL 102 05/16/2014   CO2 25 05/16/2014   Lab Results  Component Value Date   ALT 29 05/16/2014   AST 19 05/16/2014   ALKPHOS 57 05/16/2014   BILITOT 1.4 (H) 05/16/2014     RADIOGRAPHY: No results found.    IMPRESSION/PLAN: 1. 78 y.o. gentleman with Stage T1c adenocarcinoma of the prostate with Gleason Score of 3+3, and PSA of 10.15. We discussed the patient's workup and outlined the nature of prostate cancer in this setting. The patient's T stage, Gleason's score, and PSA put him into the low risk group. It is felt that his elevated PSA is most likely a reflection of his enlarged prostate with volume of 118 gm as opposed to his prostate cancer.  Accordingly, he is eligible for a variety of potential treatment options including brachytherapy, 5.5 weeks of external radiation, prostatectomy, and active surveillance. We discussed the available radiation techniques, and focused on the details and logistics and delivery. The patient is not an ideal candidate for brachytherapy with a prostate volume of 118 gm and we discussed this with him today. We discussed and outlined the risks, benefits, short and long-term effects associated with radiotherapy and compared and contrasted these with prostatectomy. We also discussed the role of SpaceOAR in reducing the rectal toxicity associated with radiotherapy. He was encouraged to ask questions that were answered to his stated satisfaction.  At the end of the conversation the patient is interested in moving forward with active surveillance but prefers to have a PSA repeated in 3-4 months as opposed to waiting 6 months. He also understands that further imaging with MRI prostate and repeat prostate biopsy will likely be included in his management as part  of routine active surveillance and he is in agreement. We will share our discussion with  Dr. Dagmar Hait and Dr. Karsten Ro and look forward to following his progress via correspondence. Of course, we are more than happy to continue to participate in his care and offer radiotherapy for treatment of his prostate cancer if indicated in the future. He knows that he is welcome to call at any time with any further questions or concerns in the interim.  Given current concerns for patient exposure during the COVID-19 pandemic, this encounter was conducted via telephone. The patient was notified in advance and was offered a [place] meeting to allow for face to face communication but unfortunately reported that he did not have the appropriate resources/technology to support such a visit and instead preferred to proceed with telephone consult. The patient has given verbal consent for this type of encounter. The time spent during this encounter was 60 minutes. The attendants for this meeting include Tyler Pita MD, Ashlyn Bruning PA-C, [place], and patient, Dominic Young. During the encounter, Tyler Pita MD, Ashlyn Bruning PA-C, and scribe, Wilburn Mylar were located at [place].  Patient, Dominic Young was located at home.    Nicholos Johns, PA-C    Tyler Pita, MD  [facility] Direct Dial: [phone]   Fax: [phone] [email or web address]   Skype   [email or web address]   This document serves as a record of services personally performed by Tyler Pita, MD and Freeman Caldron, PA-C. It was created on their behalf by Wilburn Mylar, a trained medical scribe. The creation of this record is based on the scribe's personal observations and the provider's statements to them. This document has been checked and approved by the attending provider.

## 2018-11-01 NOTE — Progress Notes (Addendum)
GU Location of Tumor / Histology: prostatic adenocarcinoma   If Prostate Cancer, Gleason Score is (3 + 3) and PSA is (10.15). Prostate volume: 117.9  Dominic Young has a history of a fluctuating PSA and a persistent elevation of 10.15 was noted prompting a prostate biopsy.    Biopsies of prostate (if applicable) revealed:      Past/Anticipated interventions by urology, if any: prostate biopsy, referral to Dr. Tammi Klippel for consideration of radiation therapy  Past/Anticipated interventions by medical oncology, if any: no  Weight changes, if any: no  Bowel/Bladder complaints, if any: IPSS 4. SHIM 1. Denies dysuria, hematuria, leakage or incontinence.   Nausea/Vomiting, if any: no  Pain issues, if any:  no  SAFETY ISSUES:  Prior radiation? no  Pacemaker/ICD? no  Possible current pregnancy? no, male patient  Is the patient on methotrexate? no  Current Complaints / other details:  78 year old male. Married.

## 2018-11-01 NOTE — Progress Notes (Signed)
See progress note under physician encounter. 

## 2018-11-12 ENCOUNTER — Ambulatory Visit: Payer: Medicare HMO | Admitting: Radiation Oncology

## 2018-11-12 ENCOUNTER — Ambulatory Visit: Payer: Medicare HMO

## 2018-12-02 ENCOUNTER — Telehealth: Payer: Self-pay | Admitting: Medical Oncology

## 2018-12-02 NOTE — Telephone Encounter (Signed)
Called Dominic Young to introduce myself as the prostate nurse navigator and discuss my role. I was unable to meet him 7/31 when he consulted with Dr. Tammi Klippel via telehealth. 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 He is scheduled for PSA check 11/12 at [facility]. I gave him my contact information and asked him to call me with questions or concerns.

## 2019-02-13 ENCOUNTER — Other Ambulatory Visit: Payer: Self-pay | Admitting: Urology

## 2019-02-13 DIAGNOSIS — C61 Malignant neoplasm of prostate: Secondary | ICD-10-CM

## 2019-04-22 ENCOUNTER — Other Ambulatory Visit: Payer: Medicare HMO

## 2019-04-28 ENCOUNTER — Ambulatory Visit: Payer: Medicare HMO | Attending: Internal Medicine

## 2019-04-28 DIAGNOSIS — Z23 Encounter for immunization: Secondary | ICD-10-CM | POA: Insufficient documentation

## 2019-04-28 NOTE — Progress Notes (Signed)
   Covid-19 Vaccination Clinic  Name:  Dominic Young    MRN: [id number] DOB: [birth year]  04/28/2019  Mr. Dominic Young was observed post Covid-19 immunization for 15 minutes without incidence. He was provided with Vaccine Information Sheet and instruction to access the V-Safe system.   Mr. Dominic Young was instructed to call 911 with any severe reactions post vaccine: Marland Kitchen Difficulty breathing  . Swelling of your face and throat  . A fast heartbeat  . A bad rash all over your body  . Dizziness and weakness    Immunizations Administered    Name Date Dose VIS Date Route   Pfizer COVID-19 Vaccine 04/28/2019  1:56 PM 0.3 mL 03/14/2019 Intramuscular   Manufacturer: [place]   Lot: [id number]   [place]: [id number]

## 2019-05-19 ENCOUNTER — Ambulatory Visit: Payer: Medicare HMO | Attending: Internal Medicine

## 2019-05-19 DIAGNOSIS — Z23 Encounter for immunization: Secondary | ICD-10-CM | POA: Insufficient documentation

## 2019-05-19 NOTE — Progress Notes (Signed)
   Covid-19 Vaccination Clinic  Name:  Dominic Young    MRN: [id number] DOB: Sep 02, 1940  05/19/2019  [name] was observed post Covid-19 immunization for 15 minutes without incidence. He was provided with Vaccine Information Sheet and instruction to access the V-Safe system.   [name] was instructed to call 911 with any severe reactions post vaccine: Marland Kitchen Difficulty breathing  . Swelling of your face and throat  . A fast heartbeat  . A bad rash all over your body  . Dizziness and weakness    Immunizations Administered    Name Date Dose VIS Date Route   Pfizer COVID-19 Vaccine 05/19/2019 12:12 PM 0.3 mL 03/14/2019 Intramuscular   Manufacturer: [place]   Lot: [id number]   [place]: [id number]

## 2019-05-24 ENCOUNTER — Ambulatory Visit: Payer: Medicare HMO

## 2020-12-09 DIAGNOSIS — M545 Low back pain, unspecified: Secondary | ICD-10-CM | POA: Insufficient documentation

## 2021-09-24 ENCOUNTER — Emergency Department (HOSPITAL_BASED_OUTPATIENT_CLINIC_OR_DEPARTMENT_OTHER)
Admission: EM | Admit: 2021-09-24 | Discharge: 2021-09-24 | Disposition: A | Discharge status: Home / Self Care | Payer: Medicare HMO | Attending: Emergency Medicine | Admitting: Emergency Medicine

## 2021-09-24 ENCOUNTER — Emergency Department (HOSPITAL_BASED_OUTPATIENT_CLINIC_OR_DEPARTMENT_OTHER): Payer: Medicare HMO

## 2021-09-24 ENCOUNTER — Other Ambulatory Visit: Payer: Self-pay

## 2021-09-24 ENCOUNTER — Encounter (HOSPITAL_BASED_OUTPATIENT_CLINIC_OR_DEPARTMENT_OTHER): Payer: Self-pay

## 2021-09-24 DIAGNOSIS — Z8546 Personal history of malignant neoplasm of prostate: Secondary | ICD-10-CM | POA: Diagnosis not present

## 2021-09-24 DIAGNOSIS — R110 Nausea: Secondary | ICD-10-CM | POA: Diagnosis not present

## 2021-09-24 DIAGNOSIS — Z7982 Long term (current) use of aspirin: Secondary | ICD-10-CM | POA: Diagnosis not present

## 2021-09-24 DIAGNOSIS — Z79899 Other long term (current) drug therapy: Secondary | ICD-10-CM | POA: Diagnosis not present

## 2021-09-24 DIAGNOSIS — R0789 Other chest pain: Secondary | ICD-10-CM | POA: Insufficient documentation

## 2021-09-24 DIAGNOSIS — I10 Essential (primary) hypertension: Secondary | ICD-10-CM | POA: Diagnosis not present

## 2021-09-24 LAB — BASIC METABOLIC PANEL
Anion gap: 9 (ref 5–15)
BUN: 19 mg/dL (ref 8–23)
CO2: 24 mmol/L (ref 22–32)
Calcium: 8.9 mg/dL (ref 8.9–10.3)
Chloride: 106 mmol/L (ref 98–111)
Creatinine, Ser: 1.11 mg/dL (ref 0.61–1.24)
GFR, Estimated: >60 mL/min (ref >60)
Glucose, Bld: 124 mg/dL — ABNORMAL HIGH (ref 70–99)
Potassium: 3.8 mmol/L (ref 3.5–5.1)
Sodium: 139 mmol/L (ref 135–145)

## 2021-09-24 LAB — CBC
HCT: 43.9 % (ref 39.0–52.0)
Hemoglobin: 14.4 g/dL (ref 13.0–17.0)
MCH: 30.4 pg (ref 26.0–34.0)
MCHC: 32.8 g/dL (ref 30.0–36.0)
MCV: 92.8 fL (ref 80.0–100.0)
Platelets: 162 10*3/uL (ref 150–400)
RBC: 4.73 MIL/uL (ref 4.22–5.81)
RDW: 13.3 % (ref 11.5–15.5)
WBC: 7.4 10*3/uL (ref 4.0–10.5)
nRBC: 0 % (ref 0.0–0.2)

## 2021-09-24 LAB — TROPONIN I (HIGH SENSITIVITY)
Troponin I (High Sensitivity): 7 ng/L (ref <18)
Troponin I (High Sensitivity): 7 ng/L (ref <18)

## 2021-09-24 NOTE — ED Triage Notes (Signed)
POV, pt sts that he was sleeping when epigastric pain began, he thought he was going to throw up, then it radiated to midsternal and left side of chest. Sts that symptoms have now resolved, denies cardiac hx.

## 2021-10-13 ENCOUNTER — Other Ambulatory Visit: Payer: Self-pay | Admitting: Internal Medicine

## 2021-10-13 DIAGNOSIS — G3184 Mild cognitive impairment, so stated: Secondary | ICD-10-CM

## 2021-10-26 ENCOUNTER — Encounter (HOSPITAL_BASED_OUTPATIENT_CLINIC_OR_DEPARTMENT_OTHER): Payer: Self-pay

## 2021-10-26 ENCOUNTER — Emergency Department (HOSPITAL_BASED_OUTPATIENT_CLINIC_OR_DEPARTMENT_OTHER): Payer: Medicare HMO | Admitting: Radiology

## 2021-10-26 ENCOUNTER — Other Ambulatory Visit: Payer: Self-pay

## 2021-10-26 ENCOUNTER — Emergency Department (HOSPITAL_BASED_OUTPATIENT_CLINIC_OR_DEPARTMENT_OTHER)
Admission: EM | Admit: 2021-10-26 | Discharge: 2021-10-26 | Disposition: A | Discharge status: Home / Self Care | Payer: Medicare HMO | Attending: Emergency Medicine | Admitting: Emergency Medicine

## 2021-10-26 ENCOUNTER — Ambulatory Visit
Admission: RE | Admit: 2021-10-26 | Discharge: 2021-10-26 | Disposition: A | Discharge status: Home / Self Care | Payer: Medicare HMO | Source: Ambulatory Visit | Attending: Internal Medicine | Admitting: Internal Medicine

## 2021-10-26 DIAGNOSIS — Y92007 Garden or yard of unspecified non-institutional (private) residence as the place of occurrence of the external cause: Secondary | ICD-10-CM | POA: Insufficient documentation

## 2021-10-26 DIAGNOSIS — W19XXXA Unspecified fall, initial encounter: Secondary | ICD-10-CM

## 2021-10-26 DIAGNOSIS — S40011A Contusion of right shoulder, initial encounter: Secondary | ICD-10-CM | POA: Diagnosis not present

## 2021-10-26 DIAGNOSIS — W010XXA Fall on same level from slipping, tripping and stumbling without subsequent striking against object, initial encounter: Secondary | ICD-10-CM | POA: Diagnosis not present

## 2021-10-26 DIAGNOSIS — I10 Essential (primary) hypertension: Secondary | ICD-10-CM | POA: Diagnosis not present

## 2021-10-26 DIAGNOSIS — S4991XA Unspecified injury of right shoulder and upper arm, initial encounter: Secondary | ICD-10-CM | POA: Diagnosis present

## 2021-10-26 DIAGNOSIS — Y9302 Activity, running: Secondary | ICD-10-CM | POA: Insufficient documentation

## 2021-10-26 DIAGNOSIS — Z7982 Long term (current) use of aspirin: Secondary | ICD-10-CM | POA: Insufficient documentation

## 2021-10-26 DIAGNOSIS — G3184 Mild cognitive impairment, so stated: Secondary | ICD-10-CM

## 2021-10-26 NOTE — ED Provider Notes (Signed)
[place] EMERGENCY DEPT Provider Note   CSN: [id number] Arrival date & time: 10/26/21  1502     History  Chief Complaint  Patient presents with   Lytle Michaels    DAMONIE ELLENWOOD is a 81 y.o. male.  Patient is an 81 year old male with a history of hypertension, dysrhythmia, hyperlipidemia who is presenting today after a fall.  Patient reports he was outside doing yard work when his feet got tangled on the hose and he started falling forward.  He was trying to run to catch his balance but ultimately fell landing on his right shoulder.  He did not hit his head or lose consciousness.  He is having pain mostly in the top and back part of his right shoulder.  Denies any numbness or tingling in his arm.  Denies any injury to his lower body.  The history is provided by the patient.  Fall       Home Medications Prior to Admission medications   Medication Sig Start Date End Date Taking? Authorizing Provider  aspirin 81 MG tablet Take 81 mg by mouth daily.    [provider]  losartan (COZAAR) 50 MG tablet Take 50 mg by mouth every morning.    [provider]  Pantoprazole Sodium (PROTONIX PO) Take 1 tablet by mouth daily.    [provider]  pravastatin (PRAVACHOL) 40 MG tablet Take 40 mg by mouth daily.    [provider]      Allergies    Xylocaine [lidocaine]    Review of Systems   Review of Systems  Physical Exam Updated Vital Signs BP 135/79 (BP Location: Left Arm)   Pulse 69   Temp 99.3 F (37.4 C)   Resp 15   Ht '6\' 4"'$  (1.93 m)   Wt 93 kg   SpO2 98%   BMI 24.95 kg/m  Physical Exam Vitals and nursing note reviewed.  Constitutional:      Appearance: Normal appearance.  HENT:     Head: Normocephalic and atraumatic.  Eyes:     Pupils: Pupils are equal, round, and reactive to light.  Cardiovascular:     Rate and Rhythm: Normal rate.     Pulses: Normal pulses.  Pulmonary:     Effort: Pulmonary effort is normal.   Musculoskeletal:     Right shoulder: Tenderness present.     Left shoulder: Normal.       Arms:       Back:     Comments: Tenderness near the scapula.  No central spinal tenderness  Skin:    General: Skin is warm and dry.  Neurological:     Mental Status: He is alert. Mental status is at baseline.  Psychiatric:        Mood and Affect: Mood normal.     ED Results / Procedures / Treatments   Labs (all labs ordered are listed, but only abnormal results are displayed) Labs Reviewed - No data to display  EKG None  Radiology DG Shoulder Right  Result Date: 10/26/2021 CLINICAL DATA:  Tripped and fell.  Right shoulder pain. EXAM: RIGHT SHOULDER - 2+ VIEW COMPARISON:  None Available. FINDINGS: The glenohumeral and AC joints are maintained. No acute fracture of the humeral head or neck is identified. The visualized right ribs are intact. IMPRESSION: No fracture or dislocation. Electronically Signed   By: Marijo Sanes M.D.   On: 10/26/2021 16:06   MR BRAIN WO CONTRAST  Result Date: 10/26/2021 CLINICAL DATA:  Memory  loss and mild cognitive impairment. EXAM: MRI HEAD WITHOUT CONTRAST TECHNIQUE: Multiplanar, multiecho pulse sequences of the brain and surrounding structures were obtained without intravenous contrast. COMPARISON:  None Available. FINDINGS: Brain: There is no acute intracranial hemorrhage, extra-axial fluid collection, or acute infarct. There is mild global parenchymal volume loss with prominence of the ventricular system and extra-axial CSF spaces. There is no discernible lobar predilection or disproportionate hippocampal atrophy. There is overall mild for age FLAIR signal abnormality in the supratentorial white matter likely reflecting mild chronic white matter microangiopathy. There is no mass lesion. There is no mass effect or midline shift. There is no suspicious parenchymal signal abnormality. Vascular: Normal flow voids. Skull and upper cervical spine: Normal marrow signal.  Sinuses/Orbits: The paranasal sinuses are clear. The globes and orbits are unremarkable. Other: None. IMPRESSION: Overall mild for age parenchymal volume loss without discernible lobar predilection or disproportionate hippocampal atrophy, and mild chronic white matter microangiopathy. Electronically Signed   By: Valetta Mole M.D.   On: 10/26/2021 15:28    Procedures Procedures    Medications Ordered in ED Medications - No data to display  ED Course/ Medical Decision Making/ A&P                           Medical Decision Making Amount and/or Complexity of Data Reviewed Radiology: ordered and independent interpretation performed. Decision-making details documented in ED Course.   Elderly male presenting today after a fall where his feet got tangled and he tripped.  No head injury, well-appearing overall.  Patient has an injury to the shoulder.  He has full range of motion of his shoulder and low suspicion for rotator cuff injury. I have independently visualized and interpreted pt's images today. X-rays today are negative of the right shoulder.  He is neurovascularly intact at this time.  No central thoracic tenderness concerning for vertebral injury.  Findings discussed with the patient and his wife.  Report of care and follow-up given.        Final Clinical Impression(s) / ED Diagnoses Final diagnoses:  Contusion of right shoulder, initial encounter  Fall, initial encounter    Rx / DC Orders ED Discharge Orders     None         Blanchie Dessert, MD 10/26/21 1708

## 2021-10-26 NOTE — ED Triage Notes (Signed)
While running fell tripped and fell forward hitting right should and head.  C/O pain to right shoulder  Denies pain to head.  Denies LOC.  Denies blood thinners NAD

## 2021-10-26 NOTE — Discharge Instructions (Addendum)
Take 2 extra strength Tylenol every 6 hours as needed for pain.  If the muscle rubs you have at home are not working you can also try getting Voltaren gel over-the-counter and you can apply that up to 4 times a day.  The x-rays today were normal without any fractures.  You are going to be very sore over the next few days.

## 2022-01-02 DIAGNOSIS — M25562 Pain in left knee: Secondary | ICD-10-CM | POA: Insufficient documentation

## 2022-03-04 ENCOUNTER — Emergency Department (HOSPITAL_BASED_OUTPATIENT_CLINIC_OR_DEPARTMENT_OTHER): Payer: Medicare HMO | Admitting: Radiology

## 2022-03-04 ENCOUNTER — Other Ambulatory Visit: Payer: Self-pay

## 2022-03-04 ENCOUNTER — Emergency Department (HOSPITAL_BASED_OUTPATIENT_CLINIC_OR_DEPARTMENT_OTHER)
Admission: EM | Admit: 2022-03-04 | Discharge: 2022-03-04 | Disposition: A | Discharge status: Home / Self Care | Payer: Medicare HMO | Attending: Emergency Medicine | Admitting: Emergency Medicine

## 2022-03-04 DIAGNOSIS — R0789 Other chest pain: Secondary | ICD-10-CM | POA: Diagnosis present

## 2022-03-04 DIAGNOSIS — Z79899 Other long term (current) drug therapy: Secondary | ICD-10-CM | POA: Insufficient documentation

## 2022-03-04 DIAGNOSIS — R0602 Shortness of breath: Secondary | ICD-10-CM | POA: Diagnosis not present

## 2022-03-04 DIAGNOSIS — Z7982 Long term (current) use of aspirin: Secondary | ICD-10-CM | POA: Insufficient documentation

## 2022-03-04 DIAGNOSIS — Z1152 Encounter for screening for COVID-19: Secondary | ICD-10-CM | POA: Diagnosis not present

## 2022-03-04 DIAGNOSIS — I10 Essential (primary) hypertension: Secondary | ICD-10-CM | POA: Insufficient documentation

## 2022-03-04 DIAGNOSIS — R053 Chronic cough: Secondary | ICD-10-CM | POA: Diagnosis not present

## 2022-03-04 DIAGNOSIS — R5383 Other fatigue: Secondary | ICD-10-CM | POA: Insufficient documentation

## 2022-03-04 LAB — URINALYSIS, ROUTINE W REFLEX MICROSCOPIC
Bilirubin Urine: NEGATIVE
Glucose, UA: NEGATIVE mg/dL
Hgb urine dipstick: NEGATIVE
Ketones, ur: NEGATIVE mg/dL
Leukocytes,Ua: NEGATIVE
Nitrite: NEGATIVE
Specific Gravity, Urine: 1.029 (ref 1.005–1.030)
pH: 6.5 (ref 5.0–8.0)

## 2022-03-04 LAB — CBC
HCT: 48.9 % (ref 39.0–52.0)
Hemoglobin: 16 g/dL (ref 13.0–17.0)
MCH: 30.5 pg (ref 26.0–34.0)
MCHC: 32.7 g/dL (ref 30.0–36.0)
MCV: 93.3 fL (ref 80.0–100.0)
Platelets: 177 10*3/uL (ref 150–400)
RBC: 5.24 MIL/uL (ref 4.22–5.81)
RDW: 14.1 % (ref 11.5–15.5)
WBC: 7.4 10*3/uL (ref 4.0–10.5)
nRBC: 0 % (ref 0.0–0.2)

## 2022-03-04 LAB — COMPREHENSIVE METABOLIC PANEL
ALT: 9 U/L (ref 0–44)
AST: 14 U/L — ABNORMAL LOW (ref 15–41)
Albumin: 4.4 g/dL (ref 3.5–5.0)
Alkaline Phosphatase: 55 U/L (ref 38–126)
Anion gap: 10 (ref 5–15)
BUN: 18 mg/dL (ref 8–23)
CO2: 25 mmol/L (ref 22–32)
Calcium: 9.1 mg/dL (ref 8.9–10.3)
Chloride: 102 mmol/L (ref 98–111)
Creatinine, Ser: 1.03 mg/dL (ref 0.61–1.24)
GFR, Estimated: >60 mL/min (ref >60)
Glucose, Bld: 113 mg/dL — ABNORMAL HIGH (ref 70–99)
Potassium: 3.9 mmol/L (ref 3.5–5.1)
Sodium: 137 mmol/L (ref 135–145)
Total Bilirubin: 1 mg/dL (ref 0.3–1.2)
Total Protein: 7 g/dL (ref 6.5–8.1)

## 2022-03-04 LAB — RESP PANEL BY RT-PCR (FLU A&B, COVID) ARPGX2
Influenza A by PCR: NEGATIVE
Influenza B by PCR: NEGATIVE
SARS Coronavirus 2 by RT PCR: NEGATIVE

## 2022-03-04 LAB — LIPASE, BLOOD: Lipase: 16 U/L (ref 11–51)

## 2022-03-04 LAB — TROPONIN I (HIGH SENSITIVITY)
Troponin I (High Sensitivity): 6 ng/L (ref <18)
Troponin I (High Sensitivity): 5 ng/L (ref <18)

## 2022-03-04 MED ORDER — ALUM & MAG HYDROXIDE-SIMETH 200-200-20 MG/5ML PO SUSP
30.0000 mL | Freq: Once | ORAL | Status: AC
  Administered 2022-03-04: 30 mL via ORAL
  Filled 2022-03-04: qty 30

## 2022-03-04 NOTE — ED Provider Notes (Signed)
[facility] [place] EMERGENCY DEPT Provider Note   CSN: [id number] Arrival date & time: 03/04/22  0854     History  Chief Complaint  Patient presents with   Chest Pain    Dominic Young is a 81 y.o. male.  With history of hypertension, GERD, hyperlipidemia, who presents ED for evaluation of chest tightness.  He states that approximately 2 PM yesterday he developed tightness in his chest that he states felt like congestion.  He states this lasted until the evening and then resolved on its own.  He felt fatigued after this and had to cancel his plans which is not normal for him.  He then reported feeling similar symptoms this morning at approximately 8 AM.  States this lasted for half hour and resolved on its own again.  Reports having 2 episodes of unfamiliar symptoms prompted him to visit the ED.  Denies specific chest pain, shortness of breath, cough, dizziness, lightheadedness, numbness, weakness, tingling, abdominal pain, nausea, vomiting, fevers, chills.  Did have 1 episode of diarrhea last night.  Has no cardiac history.   Chest Pain Associated symptoms: fatigue    Chest Pain Associated symptoms: fatigue        Home Medications Prior to Admission medications   Medication Sig Start Date End Date Taking? Authorizing Provider  aspirin 81 MG tablet Take 81 mg by mouth daily.    [provider]  losartan (COZAAR) 50 MG tablet Take 50 mg by mouth every morning.    [provider]  Pantoprazole Sodium (PROTONIX PO) Take 1 tablet by mouth daily.    [provider]  pravastatin (PRAVACHOL) 40 MG tablet Take 40 mg by mouth daily.    [provider]      Allergies    Xylocaine [lidocaine]    Review of Systems   Review of Systems  Constitutional:  Positive for fatigue.  Respiratory:  Positive for chest tightness.   All other systems reviewed and are negative.   Physical Exam Updated Vital Signs BP (!) 160/88 (BP Location: Right Arm)    Pulse 67   Temp 98.3 F (36.8 C) (Oral)   Resp 17   Ht '6\' 4"'$  (1.93 m)   Wt 94.3 kg   SpO2 97%   BMI 25.32 kg/m  Physical Exam Vitals and nursing note reviewed.  Constitutional:      General: He is not in acute distress.    Appearance: He is well-developed. He is not ill-appearing, toxic-appearing or diaphoretic.  HENT:     Head: Normocephalic and atraumatic.  Eyes:     Conjunctiva/sclera: Conjunctivae normal.  Cardiovascular:     Rate and Rhythm: Normal rate and regular rhythm.     Pulses:          Radial pulses are 2+ on the right side and 2+ on the left side.     Heart sounds: Normal heart sounds. No murmur heard. Pulmonary:     Effort: Pulmonary effort is normal. No respiratory distress.     Breath sounds: Normal breath sounds. No decreased breath sounds, wheezing, rhonchi or rales.  Abdominal:     Palpations: Abdomen is soft.     Tenderness: There is no abdominal tenderness.  Musculoskeletal:        General: No swelling. Normal range of motion.     Cervical back: Neck supple.     Right lower leg: No edema.     Left lower leg: No edema.  Skin:    General: Skin is  warm and dry.     Capillary Refill: Capillary refill takes less than 2 seconds.  Neurological:     Mental Status: He is alert.  Psychiatric:        Mood and Affect: Mood normal.    ED Results / Procedures / Treatments   Labs (all labs ordered are listed, but only abnormal results are displayed) Labs Reviewed  COMPREHENSIVE METABOLIC PANEL - Abnormal; Notable for the following components:      Result Value   Glucose, Bld 113 (*)    AST 14 (*)    All other components within normal limits  URINALYSIS, ROUTINE W REFLEX MICROSCOPIC - Abnormal; Notable for the following components:   Protein, ur TRACE (*)    All other components within normal limits  RESP PANEL BY RT-PCR (FLU A&B, COVID) ARPGX2  LIPASE, BLOOD  CBC  TROPONIN I (HIGH SENSITIVITY)  TROPONIN I (HIGH SENSITIVITY)    EKG EKG  Interpretation  Date/Time:  Saturday March 04 2022 09:04:43 EST Ventricular Rate:  74 PR Interval:  168 QRS Duration: 78 QT Interval:  388 QTC Calculation: 430 R Axis:   -22 Text Interpretation: Normal sinus rhythm Cannot rule out Anterior infarct (cited on or before 04-Mar-2022) Abnormal ECG When compared with ECG of 04-Mar-2022 09:04, Premature ventricular complexes are no longer Present when compared to ecg earlier today, less pvc. No STEMI Confirmed by Antony Blackbird [phone]) on 03/04/2022 9:13:05 AM  Radiology DG Chest 2 View  Result Date: 03/04/2022 CLINICAL DATA:  SOB chronic cough EXAM: CHEST - 2 VIEW COMPARISON:  09/24/2021 FINDINGS: The heart size and mediastinal contours are within normal limits. Lungs are hyperinflated suggesting COPD. There is no focal consolidation. No pneumothorax or pleural effusion. Aorta is calcified. There are thoracic degenerative changes. IMPRESSION: Lungs are hyperinflated suggesting COPD.  No focal consolidation. Electronically Signed   By: Sammie Bench M.D.   On: 03/04/2022 09:43    Procedures Procedures    Medications Ordered in ED Medications  alum & mag hydroxide-simeth (MAALOX/MYLANTA) 200-200-20 MG/5ML suspension 30 mL (has no administration in time range)    ED Course/ Medical Decision Making/ A&P                           Medical Decision Making Amount and/or Complexity of Data Reviewed Labs: ordered. Radiology: ordered.  Risk OTC drugs.  This patient presents to the ED for concern of chest pain, this involves an extensive number of treatment options, and is a complaint that carries with it a high risk of complications and morbidity. The emergent differential diagnosis of chest pain includes: Acute coronary syndrome, pericarditis, aortic dissection, pulmonary embolism, tension pneumothorax, and esophageal rupture.  I do not believe the patient has an emergent cause of chest pain, other urgent/non-acute considerations include,  but are not limited to: chronic angina, aortic stenosis, cardiomyopathy, myocarditis, mitral valve prolapse, pulmonary hypertension, hypertrophic obstructive cardiomyopathy (HOCM), aortic insufficiency, right ventricular hypertrophy, pneumonia, pleuritis, bronchitis, pneumothorax, tumor, gastroesophageal reflux disease (GERD), esophageal spasm, Mallory-Weiss syndrome, peptic ulcer disease, biliary disease, pancreatitis, functional gastrointestinal pain, cervical or thoracic disk disease or arthritis, shoulder arthritis, costochondritis, subacromial bursitis, anxiety or panic attack, herpes zoster, breast disorders, chest wall tumors, thoracic outlet syndrome, mediastinitis.     Co morbidities that complicate the patient evaluation   hypertension, hyperlipidemia  My initial workup includes ACS rule out, respiratory panel, GI cocktail  Additional history obtained from: Nursing notes from this visit.  I ordered, reviewed  and interpreted labs which include: CMP, CBC, lipase, troponin, delta troponin, urinalysis, respiratory panel.  Slight hyperglycemia of 113.  Labs otherwise unremarkable   I ordered imaging studies including chest x-ray I independently visualized and interpreted imaging which showed hyperinflation suggesting COPD, otherwise normal I agree with the radiologist interpretation  Cardiac Monitoring:  The patient was maintained on a cardiac monitor.  I personally viewed and interpreted the cardiac monitored which showed an underlying rhythm of: NSR with infrequent PVCs  Afebrile, slightly hypertensive but otherwise hemodynamically stable.  81 year old male presents ED for evaluation of chest tightness.  Has had 2 episodes of this since yesterday afternoon.  Most recent episode was this morning and lasted approximately 30 minutes.  Denies specific chest pain, states that this feels like congestion.  Physical exam is largely unremarkable.  Lab workup was remarkable for slight  hyperglycemia, was otherwise unremarkable including troponin and delta troponin.  EKG showed NSR with infrequent PVC.  Chest x-ray normal.  Heart score 3.  I have low suspicion for ACS at this time.  Respiratory panel negative.  Patient may have a different virus causing his fatigue symptoms.  Strongly encourage patient to follow-up with his primary care provider in 1 week.  Gave patient strict return precautions.  Stable at discharge.  At this time there does not appear to be any evidence of an acute emergency medical condition and the patient appears stable for discharge with appropriate outpatient follow up. Diagnosis was discussed with patient who verbalizes understanding of care plan and is agreeable to discharge. I have discussed return precautions with patient and wife who verbalizes understanding. Patient encouraged to follow-up with their PCP within 1 week. All questions answered.  Patient's case discussed with Dr. Sherry Ruffing who agrees with plan to discharge with follow-up.   Note: Portions of this report may have been transcribed using voice recognition software. Every effort was made to ensure accuracy; however, inadvertent computerized transcription errors may still be present.          Final Clinical Impression(s) / ED Diagnoses Final diagnoses:  None    Rx / DC Orders ED Discharge Orders     None         Roylene Reason, Hershal Coria 03/04/22 1443    Tegeler, Gwenyth Allegra, MD 03/05/22 [phone]

## 2022-03-04 NOTE — ED Triage Notes (Signed)
Pt via pov from home with cp that began last night. Wife reports that he was feeling tired all day; he was getting ready to go out and felt tightness in his chest. He began having diarrhea (x1) and nausea. Pt reports feeling like he has heartburn and indigestion at this time. Pt denies sob, except for when the pain first began. Denies cardiac hx. Pt alert & oriented, nad noted.

## 2022-03-04 NOTE — Discharge Instructions (Signed)
You have been seen today for your complaint of nonspecific chest tightness. Your lab work was reassuring and showed no abnormalities. Your imaging showed lung hyperinflation but showed no emergencies.  You should follow-up with your primary care provider regarding this. Follow up with: Primary care provider in 1 week Please seek immediate medical care if you develop any of the following symptoms: Your chest pain gets worse. You have a cough that gets worse, or you cough up blood. You have severe pain in your abdomen. You faint. You have sudden, unexplained chest discomfort. You have sudden, unexplained discomfort in your arms, back, neck, or jaw. You have shortness of breath at any time. You suddenly start to sweat, or your skin gets clammy. You feel nausea or you vomit. You suddenly feel lightheaded or dizzy. You have severe weakness, or unexplained weakness or fatigue. Your heart begins to beat quickly, or it feels like it is skipping beats. At this time there does not appear to be the presence of an emergent medical condition, however there is always the potential for conditions to change. Please read and follow the below instructions.  Do not take your medicine if  develop an itchy rash, swelling in your mouth or lips, or difficulty breathing; call 911 and seek immediate emergency medical attention if this occurs.  You may review your lab tests and imaging results in their entirety on your MyChart account.  Please discuss all results of fully with your primary care provider and other specialist at your follow-up visit.  Note: Portions of this text may have been transcribed using voice recognition software. Every effort was made to ensure accuracy; however, inadvertent computerized transcription errors may still be present.

## 2022-03-15 ENCOUNTER — Ambulatory Visit: Payer: Medicare HMO | Admitting: Cardiovascular Disease

## 2022-04-28 NOTE — Progress Notes (Unsigned)
No chief complaint on file.  History of Present Illness: 82 yo male with history of GERD, bradycardic, PVCs, HTN and hyperlipidemia who is here today to re-establish care in our office. He was seen by Dr. Debara Pickett in 2018 for dizziness. Chest CTA  in February 2016 with coronary atherosclerosis. He has been on propranolol for years for a tremor. He was seen in the ED 03/05/22 with atypical chest pain. 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    Hyperlipidemia    Hypertension    Organic erectile dysfunction    Seasonal allergic rhinitis    Tinnitus    Tremor of right hand     Past Surgical History:  Procedure Laterality Date   COLON SURGERY     repair of diverticulum 2005-2006   colonscopy      right knee sugery     1965 cartilage removed 1983 repaired of damaged ligament    SATURATION BIOPSY OF PROSTATE     TONSILLECTOMY     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     losartan (COZAAR) 50 MG tablet Take 50 mg by mouth every morning.     Pantoprazole Sodium (PROTONIX PO) Take 1 tablet by mouth daily.     pravastatin (PRAVACHOL) 40 MG tablet Take 40 mg by mouth daily.     No current facility-administered medications for this visit.    Allergies  Allergen Reactions   Xylocaine [Lidocaine]     Heart races    Social History    Socioeconomic History   Marital status: Married    Spouse name: Not on file   Number of children: 3   Years of education: 14   Highest education level: Not on file  Occupational History   Occupation: real estate   Occupation: retired from Scientist, research (medical) - Software engineer business  Tobacco Use   Smoking status: Never   Smokeless tobacco: Never  Scientific laboratory technician Use: Never used  Substance and Sexual Activity   Alcohol use: No   Drug use: No   Sexual activity: Not Currently  Other Topics Concern   Not on file  Social History Narrative   Exercises 3 days/week - treadmill, strength/resistance training, golf   Social Determinants of Health   Financial Resource Strain: Not on file  Food Insecurity: Not on file  Transportation Needs: Not on file  Physical Activity: Not on file  Stress: Not on file  Social Connections: Not on file  Intimate Partner Violence: Not on file    Family History  Problem Relation Age of Onset   Valvular heart disease Mother        valve replaced   Lung cancer Mother    Pneumonia Father  Cancer Brother    Other Son        bronchiectasis    Breast cancer Maternal Aunt    Prostate cancer Maternal Uncle     Review of Systems:  As stated in the HPI and otherwise negative.   There were no vitals taken for this visit.  Physical Examination: General: Well developed, well nourished, NAD  HEENT: OP clear, mucus membranes moist  SKIN: warm, dry. No rashes. Neuro: No focal deficits  Musculoskeletal: Muscle strength 5/5 all ext  Psychiatric: Mood and affect normal  Neck: No JVD, no carotid bruits, no thyromegaly, no lymphadenopathy.  Lungs:Clear bilaterally, no wheezes, rhonci, crackles Cardiovascular: Regular rate and rhythm. No murmurs, gallops or rubs. Abdomen:Soft. Bowel sounds present. Non-tender.  Extremities: No lower extremity edema. Pulses are 2 + in the bilateral DP/PT.  EKG:  EKG {ACTION; IS/IS VBT:[id number]} ordered today. The ekg ordered today  demonstrates ***  Recent Labs: 03/04/2022: ALT 9; BUN 18; Creatinine, Ser 1.03; Hemoglobin 16.0; Platelets 177; Potassium 3.9; Sodium 137   Lipid Panel No results found for: "CHOL", "TRIG", "HDL", "CHOLHDL", "VLDL", "LDLCALC", "LDLDIRECT"   Wt Readings from Last 3 Encounters:  03/04/22 94.3 kg  10/26/21 93 kg  09/24/21 93.4 kg    Assessment and Plan:   1.   Labs/ tests ordered today include:  No orders of the defined types were placed in this encounter.    Disposition:   F/U with me in ***    Signed, Lauree Chandler, MD, [facility] 04/28/2022 1:05 PM    [facility] [place], [place], [place]  [address] Phone: [phone]; Fax: [phone]

## 2022-05-01 ENCOUNTER — Ambulatory Visit: Payer: Medicare HMO | Attending: Cardiovascular Disease | Admitting: Cardiovascular Disease

## 2022-05-01 ENCOUNTER — Encounter: Payer: Self-pay | Admitting: Cardiovascular Disease

## 2022-05-01 VITALS — BP 130/70 | HR 69 | Ht 76.0 in | Wt 204.2 lb

## 2022-05-01 DIAGNOSIS — I10 Essential (primary) hypertension: Secondary | ICD-10-CM | POA: Diagnosis not present

## 2022-05-01 DIAGNOSIS — I2510 Atherosclerotic heart disease of native coronary artery without angina pectoris: Secondary | ICD-10-CM

## 2022-05-01 NOTE — Patient Instructions (Signed)
Medication Instructions:  No changes *If you need a refill on your cardiac medications before your next appointment, please call your pharmacy*   Lab Work: none   Testing/Procedures: Your physician has requested that you have an echocardiogram. Echocardiography is a painless test that uses sound waves to create images of your heart. It provides your doctor with information about the size and shape of your heart and how well your heart's chambers and valves are working. This procedure takes approximately one hour. There are no restrictions for this procedure. Please do NOT wear cologne, perfume, aftershave, or lotions (deodorant is allowed). Please arrive 15 minutes prior to your appointment time.   Follow-Up: At [facility], you and your health needs are our priority.  As part of our continuing mission to provide you with exceptional heart care, we have created designated Provider Care Teams.  These Care Teams include your primary Cardiologist (physician) and Advanced Practice Providers (APPs -  Physician Assistants and Nurse Practitioners) who all work together to provide you with the care you need, when you need it.  We recommend signing up for the patient portal called "MyChart".  Sign up information is provided on this After Visit Summary.  MyChart is used to connect with patients for Virtual Visits (Telemedicine).  Patients are able to view lab/test results, encounter notes, upcoming appointments, etc.  Non-urgent messages can be sent to your provider as well.   To learn more about what you can do with MyChart, go to [email or web address].    Your next appointment:   12 month(s)  Provider:   Lauree Chandler, MD

## 2022-05-04 ENCOUNTER — Ambulatory Visit (HOSPITAL_COMMUNITY): Payer: Medicare HMO | Attending: Cardiology

## 2022-05-04 DIAGNOSIS — I2510 Atherosclerotic heart disease of native coronary artery without angina pectoris: Secondary | ICD-10-CM

## 2022-05-04 LAB — ECHOCARDIOGRAM COMPLETE
Area-P 1/2: 3.17 cm2
S' Lateral: 3.3 cm

## 2022-05-13 ENCOUNTER — Emergency Department (HOSPITAL_BASED_OUTPATIENT_CLINIC_OR_DEPARTMENT_OTHER): Payer: Medicare HMO | Admitting: Radiology

## 2022-05-13 ENCOUNTER — Encounter (HOSPITAL_BASED_OUTPATIENT_CLINIC_OR_DEPARTMENT_OTHER): Payer: Self-pay | Admitting: Emergency Medicine

## 2022-05-13 ENCOUNTER — Other Ambulatory Visit: Payer: Self-pay

## 2022-05-13 ENCOUNTER — Emergency Department (HOSPITAL_BASED_OUTPATIENT_CLINIC_OR_DEPARTMENT_OTHER)
Admission: EM | Admit: 2022-05-13 | Discharge: 2022-05-13 | Disposition: A | Discharge status: Home / Self Care | Payer: Medicare HMO | Attending: Emergency Medicine | Admitting: Emergency Medicine

## 2022-05-13 DIAGNOSIS — R739 Hyperglycemia, unspecified: Secondary | ICD-10-CM | POA: Insufficient documentation

## 2022-05-13 DIAGNOSIS — R0789 Other chest pain: Secondary | ICD-10-CM | POA: Diagnosis present

## 2022-05-13 DIAGNOSIS — I10 Essential (primary) hypertension: Secondary | ICD-10-CM | POA: Insufficient documentation

## 2022-05-13 DIAGNOSIS — E871 Hypo-osmolality and hyponatremia: Secondary | ICD-10-CM | POA: Diagnosis not present

## 2022-05-13 DIAGNOSIS — R079 Chest pain, unspecified: Secondary | ICD-10-CM

## 2022-05-13 DIAGNOSIS — D696 Thrombocytopenia, unspecified: Secondary | ICD-10-CM | POA: Diagnosis not present

## 2022-05-13 DIAGNOSIS — Z7982 Long term (current) use of aspirin: Secondary | ICD-10-CM | POA: Insufficient documentation

## 2022-05-13 DIAGNOSIS — Z79899 Other long term (current) drug therapy: Secondary | ICD-10-CM | POA: Insufficient documentation

## 2022-05-13 DIAGNOSIS — K219 Gastro-esophageal reflux disease without esophagitis: Secondary | ICD-10-CM | POA: Diagnosis not present

## 2022-05-13 LAB — CBC
HCT: 44.8 % (ref 39.0–52.0)
Hemoglobin: 14.8 g/dL (ref 13.0–17.0)
MCH: 30.1 pg (ref 26.0–34.0)
MCHC: 33 g/dL (ref 30.0–36.0)
MCV: 91.2 fL (ref 80.0–100.0)
Platelets: 136 10*3/uL — ABNORMAL LOW (ref 150–400)
RBC: 4.91 MIL/uL (ref 4.22–5.81)
RDW: 13.8 % (ref 11.5–15.5)
WBC: 5.4 10*3/uL (ref 4.0–10.5)
nRBC: 0 % (ref 0.0–0.2)

## 2022-05-13 LAB — TROPONIN I (HIGH SENSITIVITY)
Troponin I (High Sensitivity): 8 ng/L (ref <18)
Troponin I (High Sensitivity): 8 ng/L (ref <18)

## 2022-05-13 LAB — BASIC METABOLIC PANEL
Anion gap: 10 (ref 5–15)
BUN: 11 mg/dL (ref 8–23)
CO2: 25 mmol/L (ref 22–32)
Calcium: 9 mg/dL (ref 8.9–10.3)
Chloride: 98 mmol/L (ref 98–111)
Creatinine, Ser: 1.14 mg/dL (ref 0.61–1.24)
GFR, Estimated: >60 mL/min (ref >60)
Glucose, Bld: 102 mg/dL — ABNORMAL HIGH (ref 70–99)
Potassium: 3.8 mmol/L (ref 3.5–5.1)
Sodium: 133 mmol/L — ABNORMAL LOW (ref 135–145)

## 2022-05-13 MED ORDER — ALUM & MAG HYDROXIDE-SIMETH 200-200-20 MG/5ML PO SUSP
30.0000 mL | Freq: Once | ORAL | Status: AC
  Administered 2022-05-13: 30 mL via ORAL
  Filled 2022-05-13: qty 30

## 2022-05-13 NOTE — ED Triage Notes (Signed)
Tightness in chest /burping all the time. Started today around 2 pm. Drinking gatorade at the time, didn't sit well and vomited. Pain has still been there

## 2022-05-13 NOTE — ED Notes (Signed)
Pt warm and dry, NAD noted. Able to speak without dyspnea. Ambulatory without assistance.

## 2022-05-13 NOTE — Discharge Instructions (Signed)
Follow-up with your cardiologist, it looks like you saw Dr. Angelena Form just around 10 days ago

## 2022-05-13 NOTE — ED Provider Notes (Signed)
Dominic Young Provider Note   CSN: [id number] Arrival date & time: 05/13/22  1532     History  No chief complaint on file.   Dominic Young is a 82 y.o. male This is an overall well-appearing 82 year old male who presents with concern for chest pressure that began at 2 PM today.  Patient with some indigestion, and 1 episode of nausea, vomiting this morning prior to onset of chest pressure.  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  He denies any exertional chest pain, shortness of breath, ongoing nausea, vomiting, fever, chills.  He has a history of high blood pressure, hyperlipidemia, no history of diabetes, tobacco abuse, or previous cardiac history.  No previous stroke.  HPI     Home Medications Prior to Admission medications   Medication Sig Start Date End Date Taking? Authorizing Provider  aspirin 81 MG tablet Take 81 mg by mouth daily.    [provider]  losartan (COZAAR) 50 MG tablet Take 50 mg by mouth every morning.    [provider]  Pantoprazole Sodium (PROTONIX PO) Take 1 tablet by mouth daily.    [provider]  pravastatin (PRAVACHOL) 40 MG tablet Take 40 mg by mouth daily.    [provider]      Allergies    Xylocaine [lidocaine]    Review of Systems   Review of Systems  Cardiovascular:  Positive for chest pain.  All other systems reviewed and are negative.   Physical Exam Updated Vital Signs BP (!) 149/84   Pulse 74   Temp 99.1 F (37.3 C) (Oral)   Resp (!) 23   SpO2 96%  Physical Exam Vitals and nursing note reviewed.  Constitutional:      General: He is not in acute distress.    Appearance: Normal appearance.  HENT:     Head: Normocephalic and atraumatic.  Eyes:     General:        Right eye: No discharge.        Left eye: No discharge.  Cardiovascular:     Rate and Rhythm: Normal  rate and regular rhythm.     Heart sounds: No murmur heard.    No friction rub. No gallop.     Comments: No significant tenderness to palpation of the chest wall Pulmonary:     Effort: Pulmonary effort is normal.     Breath sounds: Normal breath sounds.  Abdominal:     General: Bowel sounds are normal.     Palpations: Abdomen is soft.     Comments: No significant tenderness to palpation of epigastric region  Skin:    General: Skin is warm and dry.     Capillary Refill: Capillary refill takes less than 2 seconds.  Neurological:     Mental Status: He is alert and oriented to person, place, and time.  Psychiatric:        Mood and Affect: Mood normal.        Behavior: Behavior normal.     ED Results / Procedures / Treatments   Labs (all labs ordered are listed, but only abnormal results are displayed) Labs Reviewed  BASIC METABOLIC PANEL - Abnormal; Notable for the following components:      Result Value   Sodium 133 (*)    Glucose, Bld 102 (*)    All other components within normal  limits  CBC - Abnormal; Notable for the following components:   Platelets 136 (*)    All other components within normal limits  TROPONIN I (HIGH SENSITIVITY)  TROPONIN I (HIGH SENSITIVITY)    EKG None  Radiology DG Chest 2 View  Result Date: 05/13/2022 CLINICAL DATA:  Tightness in the chest, lumbar pain all the time EXAM: CHEST - 2 VIEW COMPARISON:  03/04/2022 FINDINGS: No focal consolidation. No pleural effusion or pneumothorax. Heart and mediastinal contours are unremarkable. Thoracic aortic atherosclerosis. No acute osseous abnormality. IMPRESSION: No acute cardiopulmonary disease. Electronically Signed   By: Dominic Young M.D.   On: 05/13/2022 17:23    Procedures Procedures    Medications Ordered in ED Medications  alum & mag hydroxide-simeth (MAALOX/MYLANTA) 200-200-20 MG/5ML suspension 30 mL (30 mLs Oral Given 05/13/22 1913)    ED Course/ Medical Decision Making/ A&P                               Medical Decision Making Amount and/or Complexity of Data Reviewed Labs: ordered. Radiology: ordered.  Risk OTC drugs.   This patient is a 82 y.o. male who presents to the ED for concern of chest pain, this involves an extensive number of treatment options, and is a complaint that carries with it a high risk of complications and morbidity. The emergent differential diagnosis prior to evaluation includes, but is not limited to,  ACS, AAS, PE, Mallory-Weiss, Boerhaave's, Pneumonia, acute bronchitis, asthma or COPD exacerbation, anxiety, MSK pain or traumatic injury to the chest, acid reflux versus other . This is not an exhaustive differential.   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 Pertinent results include: Patient was seen and evaluated by cardiology just 10 days ago, with some evidence of coronary artery disease, reviewed their evaluation.  No anginal symptoms on visit 1/29.  Some coronary atherosclerosis noted on chest CT.  Echo performed just 9 days ago with normal left ventricular ejection function, and no evidence of valvular disease.  Physical Exam: Physical exam performed. The pertinent findings include: Patient is overall well-appearing, he is mildly hypertensive with blood pressure 160/78 at max  Lab Tests: I ordered, and personally interpreted labs.  The pertinent results include: CBC overall unremarkable, mild thrombocytopenia, platelets 136.  BMP notable for mild hyponatremia, sodium 133, mild hyperglycemia glucose 102, otherwise unremarkable, troponin 8 x 2, with no delta.  No ongoing chest pain after administration of Maalox, Mylanta.   Imaging Studies: I ordered imaging studies including plain film chest x-ray. I independently visualized and interpreted imaging which showed no  acute intrathoracic abnormality. I agree with the radiologist interpretation.   Cardiac Monitoring:  The patient was maintained on a cardiac monitor.  My attending physician Dr. Wilkie Aye viewed and interpreted the cardiac monitored which showed an underlying rhythm of: NSR, nonspecific ST changes. I agree with this interpretation.   Medications: I ordered medication including maalox/mylant  for chest pain. Reevaluation of the patient after these medicines showed that the patient improved. I have reviewed the patients home medicines and have made adjustments as needed   Disposition: After consideration of the diagnostic results and the patients response to treatment, I feel that patient with noncardiac presentation of his chest pain, nonexertional, not associated with nausea, worse with deep breathing, worse with radiation, he was recently seen  evaluated by cardiology, and other than some atherosclerosis with no worrisome changes on his echo, and no anginal symptoms at that time.  Considered admission given advanced age and chest pain, however his presentation today seems consistent with chest pain related to acid reflux, or esophagitis, and as the symptoms are improved at this time with an unremarkable workup I think that he is stable for discharge at this time.   emergency department workup does not suggest an emergent condition requiring admission or immediate intervention beyond what has been performed at this time. The plan is: as above. The patient is safe for discharge and has been instructed to return immediately for worsening symptoms, change in symptoms or any other concerns.  I discussed this case with my attending physician Dr. Wilkie Aye who cosigned this note including patient's presenting symptoms, physical exam, and planned diagnostics and interventions. Attending physician stated agreement with plan or made changes to plan which were implemented.    Final Clinical Impression(s) / ED  Diagnoses Final diagnoses:  Chest pain, unspecified type  Gastroesophageal reflux disease, unspecified whether esophagitis present    Rx / DC Orders ED Discharge Orders          Ordered    Ambulatory referral to Cardiology  Status:  Canceled        05/13/22 2035              Olene Floss, PA-C 05/13/22 2042    Rozelle Logan, DO 05/14/22 0005

## 2022-05-24 NOTE — Progress Notes (Unsigned)
Office Visit    Patient Name: Dominic Young Date of Encounter: 05/25/2022  PCP:  Prince Solian, [facility]  Cardiologist:  Lauree Chandler, MD  Advanced Practice Provider:  No care team member to display Electrophysiologist:  None   HPI    Dominic Young is a 82 y.o. male with a past medical history significant for GERD, bradycardia, PVCs, prostate cancer, vertigo, HTN, and hyperlipidemia presents today for hospital follow-up.  He was last seen by Dr. Angelena Form January 2024 and was reestablishing care.  Originally seen by Dr. Debara Pickett in 2018 for dizziness.  Chest CT in February 2016 with coronary atherosclerosis.  He had been on propranolol for years for tremor.  Seen in the ED 04/01/2022 for atypical chest pain.  Troponin negative.  EKG with sinus, PVCs.  He stated that he had epigastric pain and nausea at that time.  Diagnosed with flu a week later.  He had been feeling better since.  He remained active.  Has never smoked.  He is retired from [employer].  Likes to play golf.  No chest pain or dyspnea over the last 8 weeks.  No lower extremity edema at that time.  He was seen in the ED 05/13/2022 and at that time he was having some chest pressure that began 2 PM that day.  Patient had some indigestion and 1 episode of nausea/vomiting earlier that morning prior to the onset of chest pain.  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  Denied any exertional chest pain, shortness of breath, ongoing nausea, vomiting, fever, chills.  Had a history of hypertension, hyperlipidemia, no history of diabetes, tobacco abuse, or previous cardiac history.  No previous stroke.  Labs were unremarkable other than mild hyponatremia troponin was negative with no delta.  There was no ongoing chest pain after administration of Maalox, Mylanta.  Thought to be a noncardiac presentation of chest pain,  nonexertional.  Today, he states that he has not had any cardiac issues.  He does have some chest congestion.  His son suggested he went to the hospital but the patient did not feel like he needed to go.  He does make an effort to stay active.  Diet includes salads, chicken, and beef.  He eats Cheerios with strawberries each morning.  He cannot think of any triggers for his acid reflux.  We reviewed labs together and his LDL is at goal.  Compliant with medications.  Blood pressure well-controlled today.  Reports no shortness of breath nor dyspnea on exertion. Reports no chest pain, pressure, or tightness. No edema, orthopnea, PND. Reports no palpitations.    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    Hyperlipidemia    Hypertension    Organic erectile dysfunction    Seasonal allergic rhinitis    Tinnitus    Tremor of right hand    Past Surgical History:  Procedure Laterality Date   COLON SURGERY     repair of diverticulum 2005-2006   colonscopy      right knee sugery     1965 cartilage removed 1983 repaired of damaged ligament    SATURATION BIOPSY OF PROSTATE     TONSILLECTOMY     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 Left ventricular ejection fraction, by  estimation, is 55 to 60%. Left  ventricular ejection fraction by 3D volume is 56 %. The left ventricle has  normal function. The left ventricle has no regional wall motion  abnormalities. Left ventricular diastolic   parameters are consistent with Grade I diastolic dysfunction (impaired  relaxation). The average left ventricular global longitudinal strain is  -23.2 %. The global longitudinal strain is normal.   2. Right ventricular systolic function is normal. The right ventricular  size is normal.   3. The mitral valve is normal in structure. Trivial mitral valve  regurgitation.   4. The aortic valve is tricuspid. There is mild calcification of the  aortic valve. There is mild thickening of the aortic valve. Aortic valve  regurgitation is trivial. Aortic valve sclerosis/calcification is present,  without any evidence of aortic  stenosis.   5. Aortic dilatation noted. There is borderline dilatation of the aortic  root, measuring 39 mm. There is borderline dilatation of the ascending  aorta, measuring 38 mm.   6. The inferior vena cava is normal in size with greater than 50%  respiratory variability, suggesting right atrial pressure of 3 mmHg.   Comparison(s): No prior Echocardiogram.   FINDINGS   Left Ventricle: Left ventricular ejection fraction, by estimation, is 55  to 60%. Left ventricular ejection fraction by 3D volume is 56 %. The left  ventricle has normal function. The left ventricle has no regional wall  motion abnormalities. The average  left ventricular global longitudinal strain is -23.2 %. The global  longitudinal strain is normal. The left ventricular internal cavity size  was normal in size. There is no left ventricular hypertrophy. Left  ventricular diastolic parameters are consistent   with Grade I diastolic dysfunction (impaired relaxation).   Right Ventricle: The right ventricular size is normal. No increase in  right ventricular wall thickness. Right  ventricular systolic function is  normal.   Left Atrium: Left atrial size was normal in size.   Right Atrium: Right atrial size was normal in size.   Pericardium: There is no evidence of pericardial effusion.   Mitral Valve: The mitral valve is normal in structure. Trivial mitral  valve regurgitation.   Tricuspid Valve: The tricuspid valve is normal in structure. Tricuspid  valve regurgitation is trivial.   Aortic Valve: The aortic valve is tricuspid. There is mild calcification  of the aortic valve. There is mild thickening of the aortic valve. Aortic  valve regurgitation is trivial. Aortic valve sclerosis/calcification is  present, without any evidence of  aortic stenosis.   Pulmonic Valve: The pulmonic valve was normal in structure. Pulmonic valve  regurgitation is trivial.   Aorta: Aortic dilatation noted. There is borderline dilatation of the  aortic root, measuring 39  mm. There is borderline dilatation of the  ascending aorta, measuring 38 mm.   Venous: The inferior vena cava is normal in size with greater than 50%  respiratory variability, suggesting right atrial pressure of 3 mmHg.   IAS/Shunts: The atrial septum is grossly normal.       EKG:  EKG is not ordered today.    Recent Labs: 03/04/2022: ALT 9 05/13/2022: BUN 11; Creatinine, Ser 1.14; Hemoglobin 14.8; Platelets 136; Potassium 3.8; Sodium 133  Recent Lipid Panel No results found for: "CHOL", "TRIG", "HDL", "CHOLHDL", "VLDL", "LDLCALC", "LDLDIRECT"   Home Medications   Current Meds  Medication Sig   aspirin 81 MG tablet Take 81 mg by mouth daily.   losartan (COZAAR) 50 MG tablet Take 50 mg by mouth every morning.   Pantoprazole Sodium (PROTONIX PO) Take 1 tablet by mouth daily.   pravastatin (PRAVACHOL) 40 MG tablet Take 40 mg by mouth daily.     Review of Systems      All other systems reviewed and are otherwise negative except as noted above.  Physical Exam    VS:  BP 126/80   Pulse 79   Ht  6' 4"$  (1.93 m)   Wt 203 lb 3.2 oz (92.2 kg)   SpO2 98%   BMI 24.73 kg/m  , BMI Body mass index is 24.73 kg/m.  Wt Readings from Last 3 Encounters:  05/25/22 203 lb 3.2 oz (92.2 kg)  05/01/22 204 lb 3.2 oz (92.6 kg)  03/04/22 208 lb (94.3 kg)     GEN: Well nourished, well developed, in no acute distress. HEENT: normal. Neck: Supple, no JVD, carotid bruits, or masses. Cardiac: RRR, no murmurs, rubs, or gallops. No clubbing, cyanosis, edema.  Radials/PT 2+ and equal bilaterally.  Respiratory:  Respirations regular and unlabored, clear to auscultation bilaterally. GI: Soft, nontender, nondistended. MS: No deformity or atrophy. Skin: Warm and dry, no rash. Neuro:  Strength and sensation are intact. Psych: Normal affect.  Assessment & Plan    Chest pain of uncertain etiology -non-cardiac in nature -continue cardiac risk modifications -LDL 93, at goal and triglycerides 73, at goal -Will be due for follow-up lipid panel in April 2024 -Continue pravastatin 40 mg daily  Hypertension -Well-controlled today -Continue Cozaar 50 mg daily  GERD -Continue pantoprazole -As needed Mylanta/Maalox -Avoid fried foods, acidic foods         Disposition: Follow up 4 month with Lauree Chandler, MD or APP.  Signed, Elgie Collard, PA-C 05/25/2022, 9:52 AM [place]

## 2022-05-25 ENCOUNTER — Encounter: Payer: Self-pay | Admitting: Physician Assistant

## 2022-05-25 ENCOUNTER — Ambulatory Visit: Payer: Medicare HMO | Attending: Physician Assistant | Admitting: Physician Assistant

## 2022-05-25 VITALS — BP 126/80 | HR 79 | Ht 76.0 in | Wt 203.2 lb

## 2022-05-25 DIAGNOSIS — I10 Essential (primary) hypertension: Secondary | ICD-10-CM

## 2022-05-25 DIAGNOSIS — R079 Chest pain, unspecified: Secondary | ICD-10-CM | POA: Diagnosis not present

## 2022-05-25 DIAGNOSIS — I2510 Atherosclerotic heart disease of native coronary artery without angina pectoris: Secondary | ICD-10-CM

## 2022-05-25 DIAGNOSIS — K219 Gastro-esophageal reflux disease without esophagitis: Secondary | ICD-10-CM

## 2022-05-25 NOTE — Patient Instructions (Signed)
Medication Instructions:  Your physician recommends that you continue on your current medications as directed. Please refer to the Current Medication list given to you today.  *If you need a refill on your cardiac medications before your next appointment, please call your pharmacy*   Lab Work: None ordered If you have labs (blood work) drawn today and your tests are completely normal, you will receive your results only by: [place] (if you have MyChart) OR A paper copy in the mail If you have any lab test that is abnormal or we need to change your treatment, we will call you to review the results.   Follow-Up: At [facility], you and your health needs are our priority.  As part of our continuing mission to provide you with exceptional heart care, we have created designated Provider Care Teams.  These Care Teams include your primary Cardiologist (physician) and Advanced Practice Providers (APPs -  Physician Assistants and Nurse Practitioners) who all work together to provide you with the care you need, when you need it.  We recommend signing up for the patient portal called "MyChart".  Sign up information is provided on this After Visit Summary.  MyChart is used to connect with patients for Virtual Visits (Telemedicine).  Patients are able to view lab/test results, encounter notes, upcoming appointments, etc.  Non-urgent messages can be sent to your provider as well.   To learn more about what you can do with MyChart, go to [email or web address].    Your next appointment:   4 months or next available  Provider:   Lauree Chandler, MD    Low-Sodium Eating Plan Sodium, which is an element that makes up salt, helps you maintain a healthy balance of fluids in your body. Too much sodium can increase your blood pressure and cause fluid and waste to be held in your body. Your health care provider or dietitian may recommend following this plan if you have high blood pressure  (hypertension), kidney disease, liver disease, or heart failure. Eating less sodium can help lower your blood pressure, reduce swelling, and protect your heart, liver, and kidneys. What are tips for following this plan? Reading food labels The Nutrition Facts label lists the amount of sodium in one serving of the food. If you eat more than one serving, you must multiply the listed amount of sodium by the number of servings. Choose foods with less than 140 mg of sodium per serving. Avoid foods with 300 mg of sodium or more per serving. Shopping  Look for lower-sodium products, often labeled as "low-sodium" or "no salt added." Always check the sodium content, even if foods are labeled as "unsalted" or "no salt added." Buy fresh foods. Avoid canned foods and pre-made or frozen meals. Avoid canned, cured, or processed meats. Buy breads that have less than 80 mg of sodium per slice. Cooking  Eat more home-cooked food and less restaurant, buffet, and fast food. Avoid adding salt when cooking. Use salt-free seasonings or herbs instead of table salt or sea salt. Check with your health care provider or pharmacist before using salt substitutes. Cook with plant-based oils, such as canola, sunflower, or olive oil. Meal planning When eating at a restaurant, ask that your food be prepared with less salt or no salt, if possible. Avoid dishes labeled as brined, pickled, cured, smoked, or made with soy sauce, miso, or teriyaki sauce. Avoid foods that contain MSG (monosodium glutamate). MSG is sometimes added to [place] food, bouillon, and some canned foods. Make  meals that can be grilled, baked, poached, roasted, or steamed. These are generally made with less sodium. General information Most people on this plan should limit their sodium intake to 1,500-2,000 mg (milligrams) of sodium each day. What foods should I eat? Fruits Fresh, frozen, or canned fruit. Fruit juice. Vegetables Fresh or frozen  vegetables. "No salt added" canned vegetables. "No salt added" tomato sauce and paste. Low-sodium or reduced-sodium tomato and vegetable juice. Grains Low-sodium cereals, including oats, puffed wheat and rice, and shredded wheat. Low-sodium crackers. Unsalted rice. Unsalted pasta. Low-sodium bread. Whole-grain breads and whole-grain pasta. Meats and other proteins Fresh or frozen (no salt added) meat, poultry, seafood, and fish. Low-sodium canned tuna and salmon. Unsalted nuts. Dried peas, beans, and lentils without added salt. Unsalted canned beans. Eggs. Unsalted nut butters. Dairy Milk. Soy milk. Cheese that is naturally low in sodium, such as ricotta cheese, fresh mozzarella, or Swiss cheese. Low-sodium or reduced-sodium cheese. Cream cheese. Yogurt. Seasonings and condiments Fresh and dried herbs and spices. Salt-free seasonings. Low-sodium mustard and ketchup. Sodium-free salad dressing. Sodium-free light mayonnaise. Fresh or refrigerated horseradish. Lemon juice. Vinegar. Other foods Homemade, reduced-sodium, or low-sodium soups. Unsalted popcorn and pretzels. Low-salt or salt-free chips. The items listed above may not be a complete list of foods and beverages you can eat. Contact a dietitian for more information. What foods should I avoid? Vegetables Sauerkraut, pickled vegetables, and relishes. Olives. [place] fries. Onion rings. Regular canned vegetables (not low-sodium or reduced-sodium). Regular canned tomato sauce and paste (not low-sodium or reduced-sodium). Regular tomato and vegetable juice (not low-sodium or reduced-sodium). Frozen vegetables in sauces. Grains Instant hot cereals. Bread stuffing, pancake, and biscuit mixes. Croutons. Seasoned rice or pasta mixes. Noodle soup cups. Boxed or frozen macaroni and cheese. Regular salted crackers. Self-rising flour. Meats and other proteins Meat or fish that is salted, canned, smoked, spiced, or pickled. Precooked or cured meat, such as  sausages or meat loaves. Berniece Salines. Ham. Pepperoni. Hot dogs. Corned beef. Chipped beef. Salt pork. Jerky. Pickled herring. Anchovies and sardines. Regular canned tuna. Salted nuts. Dairy Processed cheese and cheese spreads. Hard cheeses. Cheese curds. Blue cheese. Feta cheese. String cheese. Regular cottage cheese. Buttermilk. Canned milk. Fats and oils Salted butter. Regular margarine. Ghee. Bacon fat. Seasonings and condiments Onion salt, garlic salt, seasoned salt, table salt, and sea salt. Canned and packaged gravies. Worcestershire sauce. Tartar sauce. Barbecue sauce. Teriyaki sauce. Soy sauce, including reduced-sodium. Steak sauce. Fish sauce. Oyster sauce. Cocktail sauce. Horseradish that you find on the shelf. Regular ketchup and mustard. Meat flavorings and tenderizers. Bouillon cubes. Hot sauce. Pre-made or packaged marinades. Pre-made or packaged taco seasonings. Relishes. Regular salad dressings. Salsa. Other foods Salted popcorn and pretzels. Corn chips and puffs. Potato and tortilla chips. Canned or dried soups. Pizza. Frozen entrees and pot pies. The items listed above may not be a complete list of foods and beverages you should avoid. Contact a dietitian for more information. Summary Eating less sodium can help lower your blood pressure, reduce swelling, and protect your heart, liver, and kidneys. Most people on this plan should limit their sodium intake to 1,500-2,000 mg (milligrams) of sodium each day. Canned, boxed, and frozen foods are high in sodium. Restaurant foods, fast foods, and pizza are also very high in sodium. You also get sodium by adding salt to food. Try to cook at home, eat more fresh fruits and vegetables, and eat less fast food and canned, processed, or prepared foods. This information is not intended to replace  advice given to you by your health care provider. Make sure you discuss any questions you have with your health care provider. Document Revised: 04/25/2019  Document Reviewed: 02/19/2019 Elsevier Patient Education  [place].

## 2022-10-12 NOTE — Progress Notes (Deleted)
No chief complaint on file.  History of Present Illness: 82 yo male with history of GERD, bradycardia, PVCs, prostate cancer, vertigo, HTN and hyperlipidemia who is here today to re-establish care in our office. He was seen by Dr. Rennis Golden in 2018 for dizziness. Chest CTA  in February 2016 with coronary atherosclerosis. He has been on propranolol for years for a tremor. He was seen in the ED 03/05/22 with atypical chest pain. Troponin negative. EKG with sinus, PVCs. He says he had epigastric pain and nausea at that time. He was diagnosed with the flu a week later. He was seen in the ED February 2024 with atypical chest pain felt to be GI related. Echo February 2024 with normal LV systolic function and no valve disease.   He is here today for follow up. The patient denies any chest pain, dyspnea, palpitations, lower extremity edema, orthopnea, PND, dizziness, near syncope or syncope.   He is retired from [employer]. He plays golf.  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    Hyperlipidemia    Hypertension    Organic erectile dysfunction    Seasonal allergic rhinitis    Tinnitus    Tremor of right hand     Past Surgical History:  Procedure Laterality Date   COLON SURGERY     repair of diverticulum 2005-2006   colonscopy      right knee sugery     1965 cartilage removed 1983 repaired of damaged ligament    SATURATION BIOPSY OF PROSTATE     TONSILLECTOMY     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     losartan (COZAAR) 50 MG tablet Take 50 mg by mouth every morning.     Pantoprazole Sodium (PROTONIX PO) Take 1 tablet by mouth daily.     pravastatin (PRAVACHOL) 40 MG tablet Take 40 mg by mouth daily.     No current facility-administered medications for this visit.    Allergies  Allergen Reactions   Xylocaine [Lidocaine]     Heart races    Social History   Socioeconomic History   Marital status: Married    Spouse name: Not on file   Number of children: 3   Years of education: 14   Highest education level: Not on file  Occupational History   Occupation: real estate   Occupation: retired from [employer] - [employer] loss [employer]  Tobacco Use   Smoking status: Never   Smokeless tobacco: Never  Vaping Use   Vaping status: Never Used  Substance and Sexual Activity   Alcohol use: No   Drug use: No   Sexual activity: Not Currently  Other Topics Concern   Not on file  Social History Narrative  Exercises 3 days/week - treadmill, strength/resistance training, golf   Social Determinants of Health   Financial Resource Strain: Not on file  Food Insecurity: Not on file  Transportation Needs: Not on file  Physical Activity: Not on file  Stress: Not on file  Social Connections: Not on file  Intimate Partner Violence: Not on file    Family History  Problem Relation Age of Onset   Valvular heart disease Mother        valve replaced   Lung cancer Mother    Pneumonia Father    Cancer Brother    Other Son        bronchiectasis    Breast cancer Maternal Aunt    Prostate cancer Maternal Uncle     Review of Systems:  As stated in the HPI and otherwise negative.   There were no vitals taken for this visit.  Physical Examination: General: Well developed, well nourished, NAD  HEENT: OP clear, mucus membranes moist   SKIN: warm, dry. No rashes. Neuro: No focal deficits  Musculoskeletal: Muscle strength 5/5 all ext  Psychiatric: Mood and affect normal  Neck: No JVD, no carotid bruits, no thyromegaly, no lymphadenopathy.  Lungs:Clear bilaterally, no wheezes, rhonci, crackles Cardiovascular: Regular rate and rhythm. No murmurs, gallops or rubs. Abdomen:Soft. Bowel sounds present. Non-tender.  Extremities: No lower extremity edema. Pulses are 2 + in the bilateral DP/PT.  EKG:  EKG is  ***ordered today. The ekg ordered today demonstrates   Recent Labs: 03/04/2022: ALT 9 05/13/2022: BUN 11; Creatinine, Ser 1.14; Hemoglobin 14.8; Platelets 136; Potassium 3.8; Sodium 133   Lipid Panel No results found for: "CHOL", "TRIG", "HDL", "CHOLHDL", "VLDL", "LDLCALC", "LDLDIRECT"   Wt Readings from Last 3 Encounters:  05/25/22 92.2 kg  05/01/22 92.6 kg  03/04/22 94.3 kg   Assessment and Plan:   1. CAD without angina: Coronary atherosclerosis noted on chest CT in 2016. Echo in February 2024 with normal LV function and no valve disease. Continue ASA and statin   2. HTN: BP is controlled. No changes  Labs/ tests ordered today include:  No orders of the defined types were placed in this encounter.  Disposition:   F/U with me in one year    Signed, Verne Carrow, MD, [facility] 10/12/2022 10:05 PM    [facility] [address] [place], [place], [place]  [address] Phone: [phone]; Fax: [phone]

## 2022-10-13 ENCOUNTER — Ambulatory Visit: Payer: Medicare HMO | Attending: Cardiovascular Disease | Admitting: Cardiovascular Disease

## 2022-10-13 ENCOUNTER — Encounter: Payer: Self-pay | Admitting: Cardiovascular Disease

## 2022-11-15 NOTE — Progress Notes (Signed)
Office Visit    Patient Name: JAXEL TRESTER Date of Encounter: 11/17/2022  PCP:  Chilton Greathouse, MD   [facility]  Cardiologist:  Verne Carrow, MD  Advanced Practice Provider:  No care team member to display Electrophysiologist:  None   HPI    MISAEL BOZARD is a 82 y.o. male with a past medical history significant for GERD, bradycardia, PVCs, prostate cancer, vertigo, HTN, and hyperlipidemia presents today for hospital follow-up.  He was last seen by Dr. Clifton James January 2024 and was reestablishing care.  Originally seen by Dr. Rennis Golden in 2018 for dizziness.  Chest CT in February 2016 with coronary atherosclerosis.  He had been on propranolol for years for tremor.  Seen in the ED 04/01/2022 for atypical chest pain.  Troponin negative.  EKG with sinus, PVCs.  He stated that he had epigastric pain and nausea at that time.  Diagnosed with flu a week later.  He had been feeling better since.  He remained active.  Has never smoked.  He is retired from [employer].  Likes to play golf.  No chest pain or dyspnea over the last 8 weeks.  No lower extremity edema at that time.  He was seen in the ED 05/13/2022 and at that time he was having some chest pressure that began 2 PM that day.  Patient had some indigestion and 1 episode of nausea/vomiting earlier that morning prior to the onset of chest pain.  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  Denied any exertional chest pain, shortness of breath, ongoing nausea, vomiting, fever, chills.  Had a history of hypertension, hyperlipidemia, no history of diabetes, tobacco abuse, or previous cardiac history.  No previous stroke.  Labs were unremarkable other than mild hyponatremia troponin was negative with no delta.  There was no ongoing chest pain after administration of Maalox, Mylanta.  Thought to be a noncardiac presentation of chest pain,  nonexertional.  He was seen by me 2/24, he states that he has not had any cardiac issues.  He does have some chest congestion.  His son suggested he went to the hospital but the patient did not feel like he needed to go.  He does make an effort to stay active.  Diet includes salads, chicken, and beef.  He eats Cheerios with strawberries each morning.  He cannot think of any triggers for his acid reflux.  We reviewed labs together and his LDL is at goal.  Compliant with medications.  Blood pressure well-controlled today.   Today, he tells me he is doing well from a cardiovascular standpoint.  He does endorse some mucus that collects in his throat at times but usually is relieved by a Hall's throat drop.  His cough is nonproductive.  He does walk 30 minutes 3 times a week in the early morning.  Diet is the same as it was when I saw him 6 months ago.  I reviewed his lab work from his primary back in May and all numbers were at goal.  I do not think patient's repeat labs today.  We discussed his blood pressure specifically diastolic blood pressure and he will will plan to track at home.  Otherwise, doing okay from a cardiovascular standpoint.  Reports no shortness of breath nor dyspnea on exertion. Reports no chest pain, pressure, or tightness. No edema, orthopnea, PND. Reports no palpitations.  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    Hyperlipidemia    Hypertension    Organic erectile dysfunction    Seasonal allergic rhinitis    Tinnitus    Tremor of right hand    Past Surgical History:  Procedure Laterality Date   COLON SURGERY      repair of diverticulum 2005-2006   colonscopy      right knee sugery     1965 cartilage removed 1983 repaired of damaged ligament    SATURATION BIOPSY OF PROSTATE     TONSILLECTOMY     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 Left ventricular ejection fraction, by estimation, is 55 to 60%. Left  ventricular ejection fraction by 3D volume is 56 %. The left ventricle has  normal function. The left ventricle has no regional wall motion  abnormalities. Left ventricular diastolic   parameters are consistent with Grade I diastolic dysfunction (impaired  relaxation). The average left ventricular global longitudinal strain is  -23.2 %. The global longitudinal strain is normal.   2. Right ventricular systolic function is normal. The right ventricular  size is normal.   3. The mitral valve is normal in structure. Trivial mitral valve  regurgitation.   4. The aortic valve is tricuspid. There is mild calcification of the  aortic valve. There is mild thickening of the aortic valve. Aortic valve  regurgitation is trivial. Aortic valve sclerosis/calcification is present,  without any evidence of aortic  stenosis.   5. Aortic dilatation noted. There is borderline dilatation of the aortic  root, measuring 39 mm. There is borderline dilatation of the ascending  aorta, measuring 38 mm.   6. The inferior vena cava is normal in size with greater than 50%  respiratory variability, suggesting right atrial pressure of 3 mmHg.   Comparison(s): No prior Echocardiogram.   FINDINGS   Left Ventricle: Left ventricular ejection fraction, by  estimation, is 55  to 60%. Left ventricular ejection fraction by 3D volume is 56 %. The left  ventricle has normal function. The left ventricle has no regional wall  motion abnormalities. The average  left ventricular global longitudinal strain is -23.2 %. The global  longitudinal strain is normal. The left ventricular internal cavity size  was normal in size. There is no left ventricular hypertrophy. Left  ventricular diastolic parameters are consistent   with Grade I diastolic dysfunction (impaired relaxation).   Right Ventricle: The right ventricular size is normal. No increase in  right ventricular wall thickness. Right ventricular systolic function is  normal.   Left Atrium: Left atrial size was normal in size.   Right Atrium:  Right atrial size was normal in size.   Pericardium: There is no evidence of pericardial effusion.   Mitral Valve: The mitral valve is normal in structure. Trivial mitral  valve regurgitation.   Tricuspid Valve: The tricuspid valve is normal in structure. Tricuspid  valve regurgitation is trivial.   Aortic Valve: The aortic valve is tricuspid. There is mild calcification  of the aortic valve. There is mild thickening of the aortic valve. Aortic  valve regurgitation is trivial. Aortic valve sclerosis/calcification is  present, without any evidence of  aortic stenosis.   Pulmonic Valve: The pulmonic valve was normal in structure. Pulmonic valve  regurgitation is trivial.   Aorta: Aortic dilatation noted. There is borderline dilatation of the  aortic root, measuring 39 mm. There is borderline dilatation of the  ascending aorta, measuring 38 mm.   Venous: The inferior vena cava is normal in size with greater than 50%  respiratory variability, suggesting right atrial pressure of 3 mmHg.   IAS/Shunts: The atrial septum is grossly normal.       EKG:  EKG is not ordered today.    Recent Labs: 03/04/2022: ALT 9 05/13/2022: BUN 11; Creatinine, Ser  1.14; Hemoglobin 14.8; Platelets 136; Potassium 3.8; Sodium 133  Recent Lipid Panel No results found for: "CHOL", "TRIG", "HDL", "CHOLHDL", "VLDL", "LDLCALC", "LDLDIRECT"   Home Medications   Current Meds  Medication Sig   aspirin 81 MG tablet Take 81 mg by mouth daily.   losartan (COZAAR) 50 MG tablet Take 50 mg by mouth every morning.   Pantoprazole Sodium (PROTONIX PO) Take 1 tablet by mouth as needed.   pravastatin (PRAVACHOL) 40 MG tablet Take 40 mg by mouth daily.     Review of Systems      All other systems reviewed and are otherwise negative except as noted above.  Physical Exam    VS:  BP (!) 130/90 Comment: 120/90; Left arm  Pulse 72   Ht 6\' 4"  (1.93 m)   Wt 207 lb 3.2 oz (94 kg)   SpO2 94%   BMI 25.22 kg/m  , BMI Body mass index is 25.22 kg/m.  Wt Readings from Last 3 Encounters:  11/17/22 207 lb 3.2 oz (94 kg)  05/25/22 203 lb 3.2 oz (92.2 kg)  05/01/22 204 lb 3.2 oz (92.6 kg)     GEN: Well nourished, well developed, in no acute distress. HEENT: normal. Neck: Supple, no JVD, carotid bruits, or masses. Cardiac: RRR, no murmurs, rubs, or gallops. No clubbing, cyanosis, edema.  Radials/PT 2+ and equal bilaterally.  Respiratory:  Respirations regular and unlabored, clear to auscultation bilaterally. GI: Soft, nontender, nondistended. MS: No deformity or atrophy. Skin: Warm and dry, no rash. Neuro:  Strength and sensation are intact. Psych: Normal affect.  Assessment & Plan    Chest pain of uncertain etiology -resolved -continue cardiac risk modifications -LDL 90, at goal and triglycerides 49, at goal -Will be due for follow-up lipid panel in April 2025 -Continue pravastatin 40 mg daily  Hypertension -Well-controlled today other than his diastolic -We have asked him to track his blood pressure at home couple times a week and if diastolic remains above 85 to get a school -He will continue his low-sodium, heart healthy diet and his physical exercise  routine -Continue Cozaar 50 mg daily  GERD -Continue pantoprazole -he has not had any recent issues -As needed Mylanta/Maalox -Avoid fried foods, acidic foods  Disposition: Follow up 1 year with Verne Carrow, MD or APP.  Signed, Julian Hy  Bretta Bang, PA-C 11/17/2022, 8:52 AM [facility]

## 2022-11-17 ENCOUNTER — Encounter: Payer: Self-pay | Admitting: Physician Assistant

## 2022-11-17 ENCOUNTER — Ambulatory Visit: Payer: Medicare HMO | Attending: Physician Assistant | Admitting: Physician Assistant

## 2022-11-17 VITALS — BP 130/90 | HR 72 | Ht 76.0 in | Wt 207.2 lb

## 2022-11-17 DIAGNOSIS — I10 Essential (primary) hypertension: Secondary | ICD-10-CM | POA: Diagnosis not present

## 2022-11-17 DIAGNOSIS — R079 Chest pain, unspecified: Secondary | ICD-10-CM | POA: Diagnosis not present

## 2022-11-17 DIAGNOSIS — K219 Gastro-esophageal reflux disease without esophagitis: Secondary | ICD-10-CM

## 2022-11-17 DIAGNOSIS — E785 Hyperlipidemia, unspecified: Secondary | ICD-10-CM

## 2022-11-17 NOTE — Patient Instructions (Signed)
Medication Instructions:  Your physician recommends that you continue on your current medications as directed. Please refer to the Current Medication list given to you today.  *If you need a refill on your cardiac medications before your next appointment, please call your pharmacy*  Lab Work: None ordered If you have labs (blood work) drawn today and your tests are completely normal, you will receive your results only by: MyChart Message (if you have MyChart) OR A paper copy in the mail If you have any lab test that is abnormal or we need to change your treatment, we will call you to review the results.  Follow-Up: At [facility], you and your health needs are our priority.  As part of our continuing mission to provide you with exceptional heart care, we have created designated Provider Care Teams.  These Care Teams include your primary Cardiologist (physician) and Advanced Practice Providers (APPs -  Physician Assistants and Nurse Practitioners) who all work together to provide you with the care you need, when you need it.  We recommend signing up for the patient portal called "MyChart".  Sign up information is provided on this After Visit Summary.  MyChart is used to connect with patients for Virtual Visits (Telemedicine).  Patients are able to view lab/test results, encounter notes, upcoming appointments, etc.  Non-urgent messages can be sent to your provider as well.   To learn more about what you can do with MyChart, go to [email or web address].    Your next appointment:   1 year(s)  Provider:   Verne Carrow, MD  or Jari Favre, PA-C      Other Instructions Check your blood pressure twice a week and if your diastolic (bottom number) is greater than 85 consistently, call and let us know.  Heart-Healthy Eating Plan Many factors influence your heart health, including eating and exercise habits. Heart health is also called coronary health. Coronary risk increases with  abnormal blood fat (lipid) levels. A heart-healthy eating plan includes limiting unhealthy fats, increasing healthy fats, limiting salt (sodium) intake, and making other diet and lifestyle changes. What is my plan? Your health care provider may recommend that: You limit your fat intake to _________% or less of your total calories each day. You limit your saturated fat intake to _________% or less of your total calories each day. You limit the amount of cholesterol in your diet to less than _________ mg per day. You limit the amount of sodium in your diet to less than _________ mg per day. What are tips for following this plan? Cooking Cook foods using methods other than frying. Baking, boiling, grilling, and broiling are all good options. Other ways to reduce fat include: Removing the skin from poultry. Removing all visible fats from meats. Steaming vegetables in water or broth. Meal planning  At meals, imagine dividing your plate into fourths: Fill one-half of your plate with vegetables and green salads. Fill one-fourth of your plate with whole grains. Fill one-fourth of your plate with lean protein foods. Eat 2-4 cups of vegetables per day. One cup of vegetables equals 1 cup (91 g) broccoli or cauliflower florets, 2 medium carrots, 1 large bell pepper, 1 large sweet potato, 1 large tomato, 1 medium white potato, 2 cups (150 g) raw leafy greens. Eat 1-2 cups of fruit per day. One cup of fruit equals 1 small apple, 1 large banana, 1 cup (237 g) mixed fruit, 1 large orange,  cup (82 g) dried fruit, 1 cup (240 mL) 100%  fruit juice. Eat more foods that contain soluble fiber. Examples include apples, broccoli, carrots, beans, peas, and barley. Aim to get 25-30 g of fiber per day. Increase your consumption of legumes, nuts, and seeds to 4-5 servings per week. One serving of dried beans or legumes equals  cup (90 g) cooked, 1 serving of nuts is  oz (12 almonds, 24 pistachios, or 7 walnut  halves), and 1 serving of seeds equals  oz (8 g). Fats Choose healthy fats more often. Choose monounsaturated and polyunsaturated fats, such as olive and canola oils, avocado oil, flaxseeds, walnuts, almonds, and seeds. Eat more omega-3 fats. Choose salmon, mackerel, sardines, tuna, flaxseed oil, and ground flaxseeds. Aim to eat fish at least 2 times each week. Check food labels carefully to identify foods with trans fats or high amounts of saturated fat. Limit saturated fats. These are found in animal products, such as meats, butter, and cream. Plant sources of saturated fats include palm oil, palm kernel oil, and coconut oil. Avoid foods with partially hydrogenated oils in them. These contain trans fats. Examples are stick margarine, some tub margarines, cookies, crackers, and other baked goods. Avoid fried foods. General information Eat more home-cooked food and less restaurant, buffet, and fast food. Limit or avoid alcohol. Limit foods that are high in added sugar and simple starches such as foods made using white refined flour (white breads, pastries, sweets). Lose weight if you are overweight. Losing just 5-10% of your body weight can help your overall health and prevent diseases such as diabetes and heart disease. Monitor your sodium intake, especially if you have high blood pressure. Talk with your health care provider about your sodium intake. Try to incorporate more vegetarian meals weekly. What foods should I eat? Fruits All fresh, canned (in natural juice), or frozen fruits. Vegetables Fresh or frozen vegetables (raw, steamed, roasted, or grilled). Green salads. Grains Most grains. Choose whole wheat and whole grains most of the time. Rice and pasta, including brown rice and pastas made with whole wheat. Meats and other proteins Lean, well-trimmed beef, veal, pork, and lamb. Chicken and [place] without skin. All fish and shellfish. Wild duck, rabbit, pheasant, and venison. Egg  whites or low-cholesterol egg substitutes. Dried beans, peas, lentils, and tofu. Seeds and most nuts. Dairy Low-fat or nonfat cheeses, including ricotta and mozzarella. Skim or 1% milk (liquid, powdered, or evaporated). Buttermilk made with low-fat milk. Nonfat or low-fat yogurt. Fats and oils Non-hydrogenated (trans-free) margarines. Vegetable oils, including soybean, sesame, sunflower, olive, avocado, peanut, safflower, corn, canola, and cottonseed. Salad dressings or mayonnaise made with a vegetable oil. Beverages Water (mineral or sparkling). Coffee and tea. Unsweetened ice tea. Diet beverages. Sweets and desserts Sherbet, gelatin, and fruit ice. Small amounts of dark chocolate. Limit all sweets and desserts. Seasonings and condiments All seasonings and condiments. The items listed above may not be a complete list of foods and beverages you can eat. Contact a dietitian for more options. What foods should I avoid? Fruits Canned fruit in heavy syrup. Fruit in cream or butter sauce. Fried fruit. Limit coconut. Vegetables Vegetables cooked in cheese, cream, or butter sauce. Fried vegetables. Grains Breads made with saturated or trans fats, oils, or whole milk. Croissants. Sweet rolls. Donuts. High-fat crackers, such as cheese crackers and chips. Meats and other proteins Fatty meats, such as hot dogs, ribs, sausage, bacon, rib-eye roast or steak. High-fat deli meats, such as salami and bologna. Caviar. Domestic duck and goose. Organ meats, such as liver. Dairy Cream, sour  cream, cream cheese, and creamed cottage cheese. Whole-milk cheeses. Whole or 2% milk (liquid, evaporated, or condensed). Whole buttermilk. Cream sauce or high-fat cheese sauce. Whole-milk yogurt. Fats and oils Meat fat, or shortening. Cocoa butter, hydrogenated oils, palm oil, coconut oil, palm kernel oil. Solid fats and shortenings, including bacon fat, salt pork, lard, and butter. Nondairy cream substitutes. Salad  dressings with cheese or sour cream. Beverages Regular sodas and any drinks with added sugar. Sweets and desserts Frosting. Pudding. Cookies. Cakes. Pies. Milk chocolate or white chocolate. Buttered syrups. Full-fat ice cream or ice cream drinks. The items listed above may not be a complete list of foods and beverages to avoid. Contact a dietitian for more information. Summary Heart-healthy meal planning includes limiting unhealthy fats, increasing healthy fats, limiting salt (sodium) intake and making other diet and lifestyle changes. Lose weight if you are overweight. Losing just 5-10% of your body weight can help your overall health and prevent diseases such as diabetes and heart disease. Focus on eating a balance of foods, including fruits and vegetables, low-fat or nonfat dairy, lean protein, nuts and legumes, whole grains, and heart-healthy oils and fats. This information is not intended to replace advice given to you by your health care provider. Make sure you discuss any questions you have with your health care provider. Document Revised: 04/25/2021 Document Reviewed: 04/25/2021 Elsevier Patient Education  2024 Elsevier Inc. Low-Sodium Eating Plan Salt (sodium) helps you keep a healthy balance of fluids in your body. Too much sodium can raise your blood pressure. It can also cause fluid and waste to be held in your body. Your health care provider or dietitian may recommend a low-sodium eating plan if you have high blood pressure (hypertension), kidney disease, liver disease, or heart failure. Eating less sodium can help lower your blood pressure and reduce swelling. It can also protect your heart, liver, and kidneys. What are tips for following this plan? Reading food labels  Check food labels for the amount of sodium per serving. If you eat more than one serving, you must multiply the listed amount by the number of servings. Choose foods with less than 140 milligrams (mg) of sodium per  serving. Avoid foods with 300 mg of sodium or more per serving. Always check how much sodium is in a product, even if the label says "unsalted" or "no salt added." Shopping  Buy products labeled as "low-sodium" or "no salt added." Buy fresh foods. Avoid canned foods and pre-made or frozen meals. Avoid canned, cured, or processed meats. Buy breads that have less than 80 mg of sodium per slice. Cooking  Eat more home-cooked food. Try to eat less restaurant, buffet, and fast food. Try not to add salt when you cook. Use salt-free seasonings or herbs instead of table salt or sea salt. Check with your provider or pharmacist before using salt substitutes. Cook with plant-based oils, such as canola, sunflower, or olive oil. Meal planning When eating at a restaurant, ask if your food can be made with less salt or no salt. Avoid dishes labeled as brined, pickled, cured, or smoked. Avoid dishes made with soy sauce, miso, or teriyaki sauce. Avoid foods that have monosodium glutamate (MSG) in them. MSG may be added to some restaurant food, sauces, soups, bouillon, and canned foods. Make meals that can be grilled, baked, poached, roasted, or steamed. These are often made with less sodium. General information Try to limit your sodium intake to 1,500-2,300 mg each day, or the amount told by your  provider. What foods should I eat? Fruits Fresh, frozen, or canned fruit. Fruit juice. Vegetables Fresh or frozen vegetables. "No salt added" canned vegetables. "No salt added" tomato sauce and paste. Low-sodium or reduced-sodium tomato and vegetable juice. Grains Low-sodium cereals, such as oats, puffed wheat and rice, and shredded wheat. Low-sodium crackers. Unsalted rice. Unsalted pasta. Low-sodium bread. Whole grain breads and whole grain pasta. Meats and other proteins Fresh or frozen meat, poultry, seafood, and fish. These should have no added salt. Low-sodium canned tuna and salmon. Unsalted nuts. Dried  peas, beans, and lentils without added salt. Unsalted canned beans. Eggs. Unsalted nut butters. Dairy Milk. Soy milk. Cheese that is naturally low in sodium, such as ricotta cheese, fresh mozzarella, or Swiss cheese. Low-sodium or reduced-sodium cheese. Cream cheese. Yogurt. Seasonings and condiments Fresh and dried herbs and spices. Salt-free seasonings. Low-sodium mustard and ketchup. Sodium-free salad dressing. Sodium-free light mayonnaise. Fresh or refrigerated horseradish. Lemon juice. Vinegar. Other foods Homemade, reduced-sodium, or low-sodium soups. Unsalted popcorn and pretzels. Low-salt or salt-free chips. The items listed above may not be all the foods and drinks you can have. Talk to a dietitian to learn more. What foods should I avoid? Vegetables Sauerkraut, pickled vegetables, and relishes. Olives. [place] fries. Onion rings. Regular canned vegetables, except low-sodium or reduced-sodium items. Regular canned tomato sauce and paste. Regular tomato and vegetable juice. Frozen vegetables in sauces. Grains Instant hot cereals. Bread stuffing, pancake, and biscuit mixes. Croutons. Seasoned rice or pasta mixes. Noodle soup cups. Boxed or frozen macaroni and cheese. Regular salted crackers. Self-rising flour. Meats and other proteins Meat or fish that is salted, canned, smoked, spiced, or pickled. Precooked or cured meat, such as sausages or meat loaves. Tomasa Blase. Ham. Pepperoni. Hot dogs. Corned beef. Chipped beef. Salt pork. Jerky. Pickled herring, anchovies, and sardines. Regular canned tuna. Salted nuts. Dairy Processed cheese and cheese spreads. Hard cheeses. Cheese curds. Blue cheese. Feta cheese. String cheese. Regular cottage cheese. Buttermilk. Canned milk. Fats and oils Salted butter. Regular margarine. Ghee. Bacon fat. Seasonings and condiments Onion salt, garlic salt, seasoned salt, table salt, and sea salt. Canned and packaged gravies. Worcestershire sauce. Tartar sauce. Barbecue  sauce. Teriyaki sauce. Soy sauce, including reduced-sodium soy sauce. Steak sauce. Fish sauce. Oyster sauce. Cocktail sauce. Horseradish that you find on the shelf. Regular ketchup and mustard. Meat flavorings and tenderizers. Bouillon cubes. Hot sauce. Pre-made or packaged marinades. Pre-made or packaged taco seasonings. Relishes. Regular salad dressings. Salsa. Other foods Salted popcorn and pretzels. Corn chips and puffs. Potato and tortilla chips. Canned or dried soups. Pizza. Frozen entrees and pot pies. The items listed above may not be all the foods and drinks you should avoid. Talk to a dietitian to learn more. This information is not intended to replace advice given to you by your health care provider. Make sure you discuss any questions you have with your health care provider. Document Revised: 04/06/2022 Document Reviewed: 04/06/2022 Elsevier Patient Education  2024 [employer].

## 2023-12-18 ENCOUNTER — Encounter: Payer: Self-pay | Admitting: Neurology

## 2023-12-18 ENCOUNTER — Ambulatory Visit: Admitting: Neurology

## 2023-12-18 ENCOUNTER — Telehealth: Payer: Self-pay | Admitting: Neurology

## 2023-12-18 VITALS — BP 137/72 | HR 85

## 2023-12-18 DIAGNOSIS — H02412 Mechanical ptosis of left eyelid: Secondary | ICD-10-CM | POA: Diagnosis not present

## 2023-12-18 DIAGNOSIS — H532 Diplopia: Secondary | ICD-10-CM

## 2023-12-18 NOTE — Progress Notes (Unsigned)
 Chief Complaint  Patient presents with   New Patient (Initial Visit)    Pt in room 15. [name] wife in room.Paper referral for Acute onset diplopia.    ASSESSMENT AND PLAN  Dominic Young is a 83 y.o. male   Acute onset of left droopy eyelid, binocular double vision,  Erythematous of left eye conjunctiva red, was seen by optometrist Dr. Robinson, is treating him with doxycycline  for 3 weeks for infection, will try to get record,  Comprehensive acetylcholine receptor antibody with MuSK antibody were negative,  Examination showed binocular diplopia, persistently pointing towards the malfunction of left extraocular muscles,  MRI of the brain, MRI of the orbits with without contrast  Laboratory evaluation for inflammatory markers  3 to 4 weeks DIAGNOSTIC DATA (LABS, IMAGING, TESTING) - I reviewed patient records, labs, notes, testing and imaging myself where available.   MEDICAL HISTORY:  Ryan KANDICE Kerns, seen in request by   Robinson Mayo, OD [facility] Janey Santos, MD   History is obtained from the patient and review of electronic medical records. I personally reviewed pertinent available imaging films in PACS.   PMHx of  HTN HLD GERD Right knee surgery Prostate cancer,   Left eye issue 2 weeks, left eye began to close, not seeing as weell,  no pain,   Two when he open both eye, look to right he is OK,  changes, vertigo,   See two  Left eye infection x 3 months     Laboratory evaluation from [facility] November 19, 2023, negative acetylcholine receptor, binding, blocking, modulating antibody, musk antibody was negative  ESR was 2, normal CBC hemoglobin of 14.8,   PHYSICAL EXAM:   Vitals:   12/18/23 1254  BP: 137/72  Pulse: 85  SpO2: 97%   PHYSICAL EXAMNIATION:  Gen: NAD, conversant, well nourised, well groomed                     Cardiovascular: Regular rate rhythm, no peripheral edema, warm, nontender. Eyes: Conjunctivae clear without exudates or  hemorrhage Neck: Supple, no carotid bruits. Pulmonary: Clear to auscultation bilaterally   NEUROLOGICAL EXAM:  MENTAL STATUS: Speech/cognition: Awake, alert, oriented to history taking and casual conversation CRANIAL NERVES: CN II: Visual fields are full to confrontation. Pupils are round equal and briskly reactive to light. OD 20/50, OD 20/70-1, erythematous left conjunctiva CN III, IV, VI: left eye cover left pupil, but with encouragement, he can lift left upper eyelid to the upper edge of left eye, recommends testing demonstrate diplopia, mainly involving left extraocular muscles, cover and uncover showed left inferior exophoria, CN V: Facial sensation is intact to light touch CN VII: Face is symmetric with normal eye closure  CN VIII: Hearing is normal to causal conversation. CN IX, X: Phonation is normal. CN XI: Head turning and shoulder shrug are intact  MOTOR: There is no pronator drift of out-stretched arms. Muscle bulk and tone are normal. Muscle strength is normal.  REFLEXES: Reflexes are 1 and symmetric at the biceps, triceps, knees, and ankles. Plantar responses are flexor.  SENSORY: Length-dependent decreased vibratory sensation with absent vibratory sensation at knee, preserved light touch, pinprick  COORDINATION: There is no trunk or limb dysmetria noted.  GAIT/STANCE: Able to get off arm crossed, steady  REVIEW OF SYSTEMS:  Full 14 system review of systems performed and notable only for as above All other review of systems were negative.   ALLERGIES: Allergies  Allergen Reactions   Xylocaine [Lidocaine]  Heart races    HOME MEDICATIONS: Current Outpatient Medications  Medication Sig Dispense Refill   aspirin 81 MG tablet Take 81 mg by mouth daily.     losartan  (COZAAR ) 50 MG tablet Take 50 mg by mouth every morning.     Pantoprazole Sodium (PROTONIX PO) Take 1 tablet by mouth as needed.     pravastatin (PRAVACHOL) 40 MG tablet Take 40 mg by mouth  daily.     No current facility-administered medications for this visit.    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    Hyperlipidemia    Hypertension    Organic erectile dysfunction    Seasonal allergic rhinitis    Tinnitus    Tremor of right hand     PAST SURGICAL HISTORY: Past Surgical History:  Procedure Laterality Date   COLON SURGERY     repair of diverticulum 2005-2006   colonscopy      right knee sugery     1965 cartilage removed 1983 repaired of damaged ligament    SATURATION BIOPSY OF PROSTATE     TONSILLECTOMY     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   Procedure: RIGHT TOTAL KNEE ARTHROPLASTY;  Surgeon: Dempsey Melodi GAILS, MD;  Location: [facility];  Service: Orthopedics;  Laterality: Right;    FAMILY HISTORY: Family History  Problem Relation Age of Onset   Valvular heart disease Mother        valve replaced   Lung cancer Mother    Pneumonia Father    Cancer Brother    Other Son        bronchiectasis    Breast cancer Maternal Aunt    Prostate cancer Maternal Uncle     SOCIAL HISTORY: Social History   Socioeconomic History   Marital status: Married    Spouse name: Not on file   Number of children: 3   Years of education: 14   Highest education level: Not on file  Occupational History   Occupation: real estate   Occupation: retired from [employer] - [employer] loss [employer]  Tobacco Use   Smoking status: Never   Smokeless tobacco: Never  Vaping Use   Vaping status: Never Used  Substance and Sexual Activity   Alcohol use: No   Drug use: No   Sexual activity: Not Currently  Other Topics Concern   Not on  file  Social History Narrative   Exercises 3 days/week - treadmill, strength/resistance training, golf   Social Drivers of Health   Financial Resource Strain: Not on file  Food Insecurity: Not on file  Transportation Needs: Not on file  Physical Activity: Not on file  Stress: Not on file  Social Connections: Not on file  Intimate Partner Violence: Not on file      Modena Callander, M.D. Ph.D.  [facility] [address] [place], [place] [address] Ph: [phone] Fax: [phone]  CC:  Robinson Mayo, OD [address] [place],  [place] [address]  Avva, Ravisankar, MD

## 2023-12-18 NOTE — Telephone Encounter (Signed)
 Get medical record from his optometrist Robinson Mayo, OD

## 2023-12-19 ENCOUNTER — Ambulatory Visit: Payer: Self-pay | Admitting: Neurology

## 2023-12-19 LAB — COMPREHENSIVE METABOLIC PANEL WITH GFR
ALT: 15 IU/L (ref 0–44)
AST: 18 IU/L (ref 0–40)
Albumin: 4.2 g/dL (ref 3.7–4.7)
Alkaline Phosphatase: 68 IU/L (ref 48–129)
BUN/Creatinine Ratio: 13 (ref 10–24)
BUN: 15 mg/dL (ref 8–27)
Bilirubin Total: 0.5 mg/dL (ref 0.0–1.2)
CO2: 24 mmol/L (ref 20–29)
Calcium: 9.2 mg/dL (ref 8.6–10.2)
Chloride: 99 mmol/L (ref 96–106)
Creatinine, Ser: 1.13 mg/dL (ref 0.76–1.27)
Globulin, Total: 2.1 g/dL (ref 1.5–4.5)
Glucose: 92 mg/dL (ref 70–99)
Potassium: 4.4 mmol/L (ref 3.5–5.2)
Sodium: 139 mmol/L (ref 134–144)
Total Protein: 6.3 g/dL (ref 6.0–8.5)
eGFR: 65 mL/min/1.73 (ref >59)

## 2023-12-19 LAB — RPR: RPR Ser Ql: NONREACTIVE

## 2023-12-19 LAB — LYME DISEASE SEROLOGY W/REFLEX: Lyme Total Antibody EIA: NEGATIVE

## 2023-12-19 LAB — THYROID PANEL WITH TSH
Free Thyroxine Index: 2.5 (ref 1.2–4.9)
T3 Uptake Ratio: 31 % (ref 24–39)
T4, Total: 8 ug/dL (ref 4.5–12.0)
TSH: 1.34 u[IU]/mL (ref 0.450–4.500)

## 2023-12-19 LAB — C-REACTIVE PROTEIN: CRP: <1 mg/L (ref 0–10)

## 2023-12-19 LAB — VITAMIN B12: Vitamin B-12: 266 pg/mL (ref 232–1245)

## 2023-12-19 LAB — SEDIMENTATION RATE: Sed Rate: 3 mm/h (ref 0–30)

## 2023-12-19 LAB — ANA W/REFLEX IF POSITIVE: Anti Nuclear Antibody (ANA): NEGATIVE

## 2023-12-28 ENCOUNTER — Ambulatory Visit (HOSPITAL_COMMUNITY)
Admission: RE | Admit: 2023-12-28 | Discharge: 2023-12-28 | Disposition: A | Discharge status: Home / Self Care | Source: Ambulatory Visit | Attending: Neurology | Admitting: Neurology

## 2023-12-28 DIAGNOSIS — H02412 Mechanical ptosis of left eyelid: Secondary | ICD-10-CM | POA: Diagnosis present

## 2023-12-28 DIAGNOSIS — H532 Diplopia: Secondary | ICD-10-CM | POA: Diagnosis present

## 2023-12-28 MED ORDER — GADOBUTROL 1 MMOL/ML IV SOLN
9.0000 mL | Freq: Once | INTRAVENOUS | Status: AC | PRN
  Administered 2023-12-28: 9 mL via INTRAVENOUS

## 2023-12-29 ENCOUNTER — Telehealth: Payer: Self-pay | Admitting: Neurology

## 2023-12-29 NOTE — Telephone Encounter (Signed)
 Please call patient MRI of the brain showed no acute abnormality, mild small vessel disease, generalized atrophy, age-related, no significant change compared to previous study in July 2023  MRI of orbits were normal  1. No evidence of an acute intracranial abnormality. 2. 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 3. Mild-to-moderate generalized cerebral atrophy.   MRI orbits:   Unremarkable MRI appearance of the orbits.

## 2023-12-31 NOTE — Telephone Encounter (Signed)
 Called and spoke to pt, results/recommendations given, pt voiced understanding and gratitude of all discussed. PT WANTS TO KNOW WHAT ARE NEXT STEPS. Routing to Dr. Onita to respond

## 2023-12-31 NOTE — Telephone Encounter (Signed)
 Called and relayed the message. Pt verbalized appreciation.

## 2023-12-31 NOTE — Telephone Encounter (Signed)
 He should follow up with his eye doctor, give him a follow up visit with NP in their next available.

## 2024-01-01 NOTE — Telephone Encounter (Signed)
 Pt wife called Pt has appt with EYE doctor today at [facility] at 1:45 : and wanted to know  if MD can fax over last MRI results to [facility] Pt wife  didn't have Fax number at time of call . Pt wife states Pt eye doctor requesting this Mri Before appt

## 2024-01-01 NOTE — Telephone Encounter (Signed)
 Pt has been scheduled for the 1-2 mo f/u with a NP, he is on wait list .

## 2024-03-20 ENCOUNTER — Encounter: Payer: Self-pay | Admitting: Cardiovascular Disease

## 2024-03-20 ENCOUNTER — Ambulatory Visit: Admitting: Cardiovascular Disease

## 2024-03-20 VITALS — BP 122/62 | HR 81 | Ht 76.0 in | Wt 215.0 lb

## 2024-03-20 DIAGNOSIS — I2510 Atherosclerotic heart disease of native coronary artery without angina pectoris: Secondary | ICD-10-CM | POA: Diagnosis not present

## 2024-03-20 DIAGNOSIS — I10 Essential (primary) hypertension: Secondary | ICD-10-CM

## 2024-03-20 NOTE — Patient Instructions (Signed)

## 2024-03-20 NOTE — Progress Notes (Signed)
 Chief Complaint  Patient presents with   Follow-up    CAD   History of Present Illness: 83 yo male with history of CAD, GERD, bradycardia, PVCs, prostate cancer, vertigo, HTN and hyperlipidemia who is here today for follow up. Chest CTA  in February 2016 with coronary atherosclerosis. He was seen in the ED 03/05/22 with atypical chest pain. Troponin negative. EKG with sinus, PVCs. He had epigastric pain and nausea at that time. He was diagnosed with the flu a week later. Echo February 2024 with LVEF=55-60%. Trivial MR and AI  He is here today for follow up. The patient denies any chest pain, dyspnea, palpitations, lower extremity edema, orthopnea, PND, dizziness, near syncope or syncope. He walks 30 minutes almost every day.   He is retired from [employer]. He plays golf.  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    Hyperlipidemia    Hypertension    Organic erectile dysfunction    Seasonal allergic rhinitis    Tinnitus    Tremor of right hand     Past Surgical History:  Procedure Laterality Date   COLON SURGERY     repair of diverticulum 2005-2006   colonscopy      right knee sugery     1965 cartilage removed 1983 repaired of damaged ligament    SATURATION BIOPSY OF PROSTATE     TONSILLECTOMY     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   Procedure: RIGHT TOTAL KNEE ARTHROPLASTY;  Surgeon: Dempsey Melodi GAILS, MD;  Location: [facility];  Service: Orthopedics;  Laterality: Right;    Current Outpatient Medications  Medication Sig Dispense Refill   Cyanocobalamin (VITAMIN B 12 PO) Take 1,000  Units by mouth daily at 6 (six) AM.     escitalopram (LEXAPRO) 5 MG tablet Take 5 mg by mouth daily.     losartan  (COZAAR ) 50 MG tablet Take 50 mg by mouth every morning.     rosuvastatin (CRESTOR) 20 MG tablet Take 20 mg by mouth daily.     aspirin 81 MG tablet Take 81 mg by mouth daily.     ezetimibe (ZETIA) 10 MG tablet Take 10 mg by mouth daily.     memantine (NAMENDA) 10 MG tablet Take 10 mg by mouth daily.     Pantoprazole Sodium (PROTONIX PO) Take 1 tablet by mouth as needed.     No current facility-administered medications for this visit.    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  Tobacco Use   Smoking status: Never   Smokeless tobacco: Never  Vaping Use   Vaping status: Never Used  Substance and Sexual Activity   Alcohol use: No   Drug use: No   Sexual activity: Not Currently  Other Topics Concern   Not on file  Social History Narrative   Exercises 3 days/week - treadmill, strength/resistance training, golf   Social Drivers of Health   Tobacco Use: Low Risk (03/20/2024)   Patient History    Smoking Tobacco Use: Never    Smokeless Tobacco Use: Never    Passive Exposure: Not on file  Financial Resource Strain: Not on file  Food Insecurity: Not on file  Transportation Needs: Not on file  Physical Activity: Not on file  Stress: Not on file  Social Connections: Not on file  Intimate Partner Violence: Not on file  Depression (EYV7-0): Not on file  Alcohol Screen: Not on file  Housing: Not on file  Utilities: Not on file  Health Literacy: Not on file    Family History  Problem Relation Age of Onset   Valvular heart disease Mother         valve replaced   Lung cancer Mother    Pneumonia Father    Cancer Brother    Other Son        bronchiectasis    Breast cancer Maternal Aunt    Prostate cancer Maternal Uncle     Review of Systems:  As stated in the HPI and otherwise negative.   BP 122/62   Pulse 81   Ht 6' 4 (1.93 m)   Wt 215 lb (97.5 kg)   SpO2 93%   BMI 26.17 kg/m   Physical Examination: General: Well developed, well nourished, NAD  HEENT: OP clear, mucus membranes moist  SKIN: warm, dry. No rashes. Neuro: No focal deficits  Musculoskeletal: Muscle strength 5/5 all ext  Psychiatric: Mood and affect normal  Neck: No JVD, no carotid bruits, no thyromegaly, no lymphadenopathy.  Lungs:Clear bilaterally, no wheezes, rhonci, crackles Cardiovascular: Regular rate and rhythm. No murmurs, gallops or rubs. Abdomen:Soft. Bowel sounds present. Non-tender.  Extremities: No lower extremity edema. Pulses are 2 + in the bilateral DP/PT.  EKG:  EKG is  ordered today. The ekg ordered today demonstrates  EKG Interpretation Date/Time:  Thursday March 20 2024 13:59:54 EST Ventricular Rate:  81 PR Interval:  156 QRS Duration:  76 QT Interval:  390 QTC Calculation: 453 R Axis:   -28  Text Interpretation: Sinus rhythm with PVC Confirmed by Verlin Bruckner [phone]) on 03/20/2024 2:08:07 PM    Recent Labs: 12/18/2023: ALT 15; BUN 15; Creatinine, Ser 1.13; Potassium 4.4; Sodium 139; TSH 1.340   Lipid Panel No results found for: CHOL, TRIG, HDL, CHOLHDL, VLDL, LDLCALC, LDLDIRECT   Wt Readings from Last 3 Encounters:  03/20/24 215 lb (97.5 kg)  11/17/22 207 lb 3.2 oz (94 kg)  05/25/22 203 lb 3.2 oz (92.2 kg)   Assessment and Plan:   1. CAD without angina: Coronary atherosclerosis noted on chest CT in 2016. Echo in 2024 with normal LV function.  -Continue ASA and Pravastatin  2. HTN: BP is controlled.   Labs/ tests ordered today include:  Orders Placed This Encounter  Procedures   EKG  12-Lead   Disposition:   F/U with me in one year    Signed, Bruckner Verlin, MD, [facility] 03/20/2024 2:18 PM    [facility] [address] [place], [place], [place]  [address] Phone: [phone]; Fax: [phone]

## 2024-04-15 ENCOUNTER — Telehealth: Payer: Self-pay | Admitting: Neurology

## 2024-04-15 ENCOUNTER — Ambulatory Visit: Admitting: Neurology

## 2024-04-15 ENCOUNTER — Encounter: Payer: Self-pay | Admitting: Neurology

## 2024-04-15 VITALS — BP 140/70 | HR 76 | Ht 75.0 in | Wt 213.5 lb

## 2024-04-15 DIAGNOSIS — H532 Diplopia: Secondary | ICD-10-CM | POA: Diagnosis not present

## 2024-04-15 DIAGNOSIS — H02412 Mechanical ptosis of left eyelid: Secondary | ICD-10-CM | POA: Diagnosis not present

## 2024-04-15 NOTE — Progress Notes (Signed)
 "  Chief Complaint  Patient presents with   Follow-up    Room 13 With wife Diplopia  Mechanical ptosis, left     ASSESSMENT AND PLAN  Dominic Young is a 84 y.o. male   Acute onset of left droopy eyelid, binocular double vision Sept 2024  -MRI of the brain showed mild small vessel disease, mild to moderate generalized cerebral atrophy.   -MRA head unremarkable.  -MRI of the orbits was normal.  -Dr. Robinson reportedly treating him for infection with doxycycline  3 weeks with red conjunctiva shortly after onset. Will get records.   -Comprehensive acetylcholine receptor antibody with MuSK antibody were negative  -Inflammatory markers were unremarkable  -Continues with significant left ptosis, Dr. Onita came to see patient with me, will recheck AchR antibodies with MUSK reflex. Scheduled to have blepharoplasty in March.  DIAGNOSTIC DATA (LABS, IMAGING, TESTING) - I reviewed patient records, labs, notes, testing and imaging myself where available.  B12 266, RPR negative, Lyme disease unremarkable, ANA/CRP/sed rate were normal.  TSH 1.340.  MEDICAL HISTORY:  Dominic Young, is a 84 year old male, seen in request by optometrist Dr. Robinson, [name], for evaluation of left droopy eyelid, double vision, he is accompanied by his wife Dominic Young at today's visit December 18, 2023, his primary care physician is [facility] Dr. Janey, Ravisankar,    History is obtained from the patient and review of electronic medical records. I personally reviewed pertinent available imaging films in PACS.   PMHx of  HTN HLD GERD Right knee surgery Prostate cancer,   He began to notice left eye issue at the beginning of September, mild swelling irritation, then had difficulty opening his left eye, droopy eyelid to the point of blocking his left vision, he also noticed binocular double vision, the relationship of to imaging tends to change depend on gaze direction,  He denies dysarthria no dysphagia no  limb muscle weakness,    he was seen by optometrist Dr. Robinson, was told to have left eye infection, is treated with doxycycline  for 3 weeks, but I do not have record   Laboratory evaluation from [facility] November 19, 2023, negative acetylcholine receptor, binding, blocking, modulating antibody, musk antibody was negative  ESR was 2, normal CBC hemoglobin of 14.8,  Update 04/15/24 SS: MRI of the brain showed mild small vessel disease, mild to moderate generalized cerebral atrophy.  MRI of the orbits was normal. Is now on B12. Vision is fine, planning for blepharoplasty in March. Diagnosed with mild dementia. Left eyelid is droopy, essentially completely closed.   PHYSICAL EXAM:   Vitals:   04/15/24 1305  BP: (!) 140/70  Pulse: 76  SpO2: 98%  Weight: 213 lb 8 oz (96.8 kg)  Height: 6' 3 (1.905 m)   Physical Exam  General: The patient is alert and cooperative at the time of the examination.  Skin: No significant peripheral edema is noted.  Neurologic Exam  Mental status: The patient is alert and oriented x 3 at the time of the examination. The patient has apparent normal recent and remote memory, with an apparently normal attention span and concentration ability.  Cranial nerves: Significant left ptosis, near complete, left eye open weakness, diplopia when looking down. Right eye 20/70, left eye 20/50. Left conjunctiva is mildly red.  Motor: The patient has good strength in all 4 extremities.  Sensory examination: Soft touch sensation is symmetric on the face, arms, and legs.  Coordination: The patient has good finger-nose-finger and heel-to-shin bilaterally.  Gait  and station: The patient has a normal gait.  Reflexes: Deep tendon reflexes are symmetric.  REVIEW OF SYSTEMS:  Full 14 system review of systems performed and notable only for as above All other review of systems were negative.   ALLERGIES: Allergies  Allergen Reactions   Xylocaine [Lidocaine]     Heart races     HOME MEDICATIONS: Current Outpatient Medications  Medication Sig Dispense Refill   aspirin 81 MG tablet Take 81 mg by mouth daily.     Cyanocobalamin (VITAMIN B 12 PO) Take 1,000 Units by mouth daily at 6 (six) AM.     escitalopram (LEXAPRO) 5 MG tablet Take 5 mg by mouth daily.     ezetimibe (ZETIA) 10 MG tablet Take 10 mg by mouth daily.     losartan  (COZAAR ) 50 MG tablet Take 50 mg by mouth every morning.     memantine (NAMENDA) 10 MG tablet Take 10 mg by mouth daily.     Pantoprazole Sodium (PROTONIX PO) Take 1 tablet by mouth as needed.     rosuvastatin (CRESTOR) 20 MG tablet Take 20 mg by mouth daily.     No current facility-administered medications for this visit.    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    Hyperlipidemia    Hypertension    Organic erectile dysfunction    Seasonal allergic rhinitis    Tinnitus    Tremor of right hand     PAST SURGICAL HISTORY: Past Surgical History:  Procedure Laterality Date   COLON SURGERY     repair of diverticulum 2005-2006   colonscopy      right knee sugery     1965 cartilage removed 1983 repaired of damaged ligament    SATURATION BIOPSY OF PROSTATE     TONSILLECTOMY     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   Procedure: RIGHT TOTAL KNEE ARTHROPLASTY;  Surgeon: Dempsey Melodi GAILS, MD;  Location: [facility];  Service: Orthopedics;  Laterality: Right;    FAMILY HISTORY: Family History  Problem Relation Age of Onset   Valvular heart disease Mother        valve replaced   Lung cancer Mother    Pneumonia Father    Cancer Brother    Other Son        bronchiectasis     Breast cancer Maternal Aunt    Prostate cancer Maternal Uncle     SOCIAL HISTORY: Social History   Socioeconomic History   Marital status: Married    Spouse name: Not on file   Number of children: 3   Years of education: 14   Highest education level: Not on file  Occupational History   Occupation: real estate   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  Tobacco Use   Smoking status: Never   Smokeless tobacco: Never  Vaping Use   Vaping status: Never Used  Substance and Sexual Activity   Alcohol use: No   Drug use: No   Sexual activity: Not Currently  Other Topics Concern  Not on file  Social History Narrative   Exercises 3 days/week - treadmill, strength/resistance training, golf   Social Drivers of Health   Tobacco Use: Low Risk (04/15/2024)   Patient History    Smoking Tobacco Use: Never    Smokeless Tobacco Use: Never    Passive Exposure: Not on file  Financial Resource Strain: Not on file  Food Insecurity: Not on file  Transportation Needs: Not on file  Physical Activity: Not on file  Stress: Not on file  Social Connections: Not on file  Intimate Partner Violence: Not on file  Depression (EYV7-0): Not on file  Alcohol Screen: Not on file  Housing: Not on file  Utilities: Not on file  Health Literacy: Not on file   Lauraine Born, SCHARLENE, DNP  [facility] [address] [place], [place] [address] [phone]  "

## 2024-04-15 NOTE — Telephone Encounter (Signed)
Patient's wife called to verify appointment time.

## 2024-04-15 NOTE — Telephone Encounter (Signed)
 Please get the notes from eye doctor. Dr. Robinson. Thanks

## 2024-04-15 NOTE — Patient Instructions (Signed)
 Recheck labs Will get the records from your eye doctor

## 2024-04-16 NOTE — Progress Notes (Signed)
 Chart reviewed, agree above plan ?

## 2024-04-17 ENCOUNTER — Telehealth: Payer: Self-pay | Admitting: Neurology

## 2024-04-17 MED ORDER — PYRIDOSTIGMINE BROMIDE 60 MG PO TABS: 60.0000 mg | ORAL_TABLET | Freq: Three times a day (TID) | ORAL | 5 refills | Status: AC | PRN

## 2024-04-17 NOTE — Telephone Encounter (Signed)
 Lvm 1st attempt by hf 04/17/24

## 2024-04-17 NOTE — Telephone Encounter (Signed)
 I called the patient and his wife, labs show + AChR binding, Ab 1.04. other labs are pending. Will start Mestinon  as trial up to 60 mg TID PRN. Do not proceed with blepharoplasty.   Meds ordered this encounter  Medications   pyridostigmine  (MESTINON ) 60 MG tablet    Sig: Take 1 tablet (60 mg total) by mouth 3 (three) times daily as needed.    Dispense:  90 tablet    Refill:  5

## 2024-04-21 NOTE — Telephone Encounter (Signed)
 Records received from eye doctor. Copy sent to medical records for scanning. Copy placed in Sarah's office for review.

## 2024-04-21 NOTE — Telephone Encounter (Signed)
 Patient scheduled appointment at 10:00 am 08/14/24

## 2024-04-21 NOTE — Telephone Encounter (Signed)
 Called and LVM for pt to call and schedule 4 mo f/u with Dr. Onita.

## 2024-04-22 ENCOUNTER — Telehealth: Payer: Self-pay | Admitting: Neurology

## 2024-04-22 LAB — ACHR ALL WITH REFLEX TO MUSK
AChR Binding Ab, Serum: 1.04 nmol/L — ABNORMAL HIGH (ref 0.00–0.24)
AChR-modulating Ab: 44 % (ref 0–45)
Acetylchol Block Ab: 30 % — ABNORMAL HIGH (ref 0–25)

## 2024-04-22 NOTE — Telephone Encounter (Signed)
 I received notes from [facility] ophthalmology, paper charting, difficult to discern notes.  I do not see anything significant.  Please send labs over to [facility] ophthalmology, Dr. Robinson.  Labs show positive ACH R, acetylcholine blocking antibody.  Consistent with myasthenia gravis.  I called patient  last week and started on Mestinon .

## 2024-04-22 NOTE — Telephone Encounter (Signed)
 Faxed as requested to dr. Robinson

## 2024-08-14 ENCOUNTER — Ambulatory Visit: Admitting: Neurology
